# Patient Record
Sex: Female | Born: 1970
Health system: Southern US, Community
[De-identification: ages and names within clinical notes are randomized; demographics above are authoritative.]

## PROBLEM LIST (undated history)

## (undated) HISTORY — PX: BREAST SURGERY: SHX581

## (undated) HISTORY — PX: THYROIDECTOMY, PARTIAL: SHX18

## (undated) HISTORY — PX: BREAST BIOPSY: SHX20

---

## 1998-12-03 ENCOUNTER — Other Ambulatory Visit: Admission: RE | Admit: 1998-12-03 | Discharge: 1998-12-03 | Payer: Self-pay | Admitting: Internal Medicine

## 1999-12-03 ENCOUNTER — Encounter: Payer: Self-pay | Admitting: Internal Medicine

## 1999-12-03 ENCOUNTER — Encounter: Admission: RE | Admit: 1999-12-03 | Discharge: 1999-12-03 | Payer: Self-pay | Admitting: Internal Medicine

## 1999-12-08 ENCOUNTER — Encounter: Payer: Self-pay | Admitting: Internal Medicine

## 1999-12-08 ENCOUNTER — Encounter: Admission: RE | Admit: 1999-12-08 | Discharge: 1999-12-08 | Payer: Self-pay | Admitting: Internal Medicine

## 1999-12-08 ENCOUNTER — Other Ambulatory Visit: Admission: RE | Admit: 1999-12-08 | Discharge: 1999-12-08 | Payer: Self-pay | Admitting: Internal Medicine

## 2001-11-15 ENCOUNTER — Encounter: Admission: RE | Admit: 2001-11-15 | Discharge: 2001-11-15 | Payer: Self-pay | Admitting: Obstetrics & Gynecology

## 2001-11-15 ENCOUNTER — Encounter: Payer: Self-pay | Admitting: Obstetrics & Gynecology

## 2002-08-07 ENCOUNTER — Inpatient Hospital Stay (HOSPITAL_COMMUNITY): Admission: AD | Admit: 2002-08-07 | Discharge: 2002-08-07 | Payer: Self-pay | Admitting: *Deleted

## 2002-09-22 ENCOUNTER — Inpatient Hospital Stay (HOSPITAL_COMMUNITY): Admission: AD | Admit: 2002-09-22 | Discharge: 2002-09-26 | Payer: Self-pay | Admitting: *Deleted

## 2002-09-23 ENCOUNTER — Encounter (INDEPENDENT_AMBULATORY_CARE_PROVIDER_SITE_OTHER): Payer: Self-pay | Admitting: Specialist

## 2002-09-27 ENCOUNTER — Encounter: Admission: RE | Admit: 2002-09-27 | Discharge: 2002-10-27 | Payer: Self-pay | Admitting: Obstetrics & Gynecology

## 2002-10-20 ENCOUNTER — Other Ambulatory Visit: Admission: RE | Admit: 2002-10-20 | Discharge: 2002-10-20 | Payer: Self-pay | Admitting: Obstetrics & Gynecology

## 2003-10-05 ENCOUNTER — Other Ambulatory Visit: Admission: RE | Admit: 2003-10-05 | Discharge: 2003-10-05 | Payer: Self-pay | Admitting: *Deleted

## 2004-04-18 ENCOUNTER — Encounter (INDEPENDENT_AMBULATORY_CARE_PROVIDER_SITE_OTHER): Payer: Self-pay | Admitting: Specialist

## 2004-04-18 ENCOUNTER — Inpatient Hospital Stay (HOSPITAL_COMMUNITY): Admission: AD | Admit: 2004-04-18 | Discharge: 2004-04-21 | Payer: Self-pay | Admitting: *Deleted

## 2004-04-22 ENCOUNTER — Encounter: Admission: RE | Admit: 2004-04-22 | Discharge: 2004-05-22 | Payer: Self-pay | Admitting: *Deleted

## 2005-09-30 ENCOUNTER — Ambulatory Visit: Payer: Self-pay | Admitting: Internal Medicine

## 2005-11-30 ENCOUNTER — Ambulatory Visit: Payer: Self-pay | Admitting: Internal Medicine

## 2006-04-01 IMAGING — US US OB LIMITED
1 series · 14 of 18 positions shown · non-contrast
Comparison: none

CLINICAL DATA: 37 week 1 day assigned gestational age.  Bleeding.  Ruptured membranes.

[Series 1: unknown · 0.35mm/px · 14 of 18 slices shown]
[im 1/18]
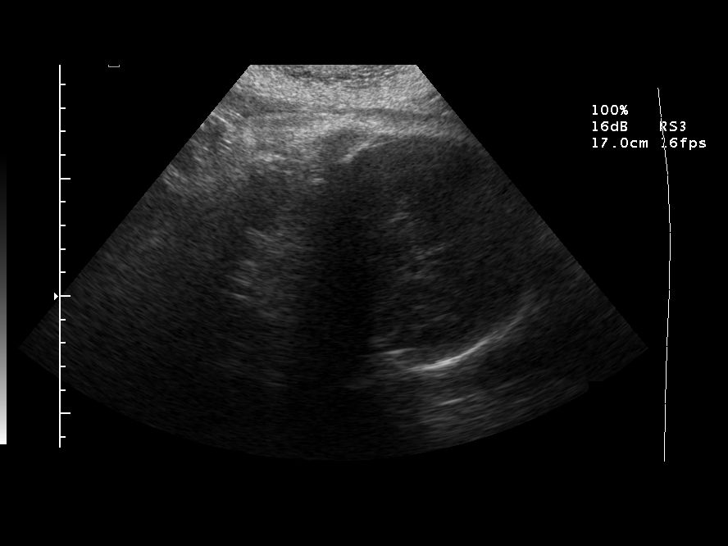
[im 2/18]
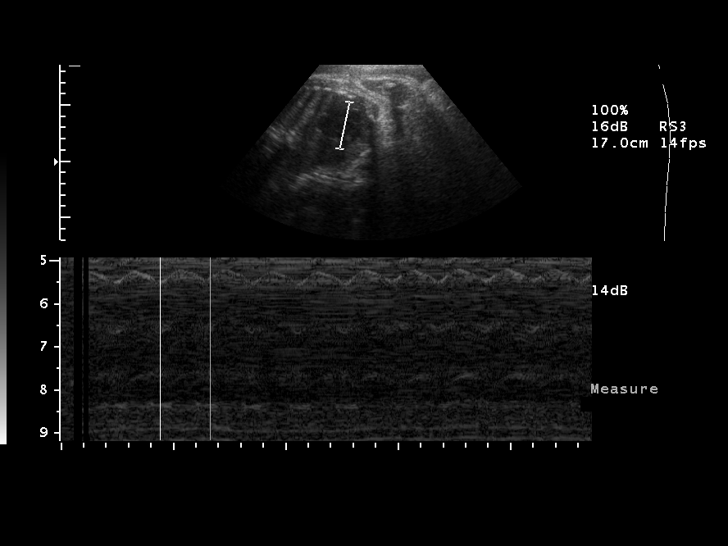
[im 4/18]
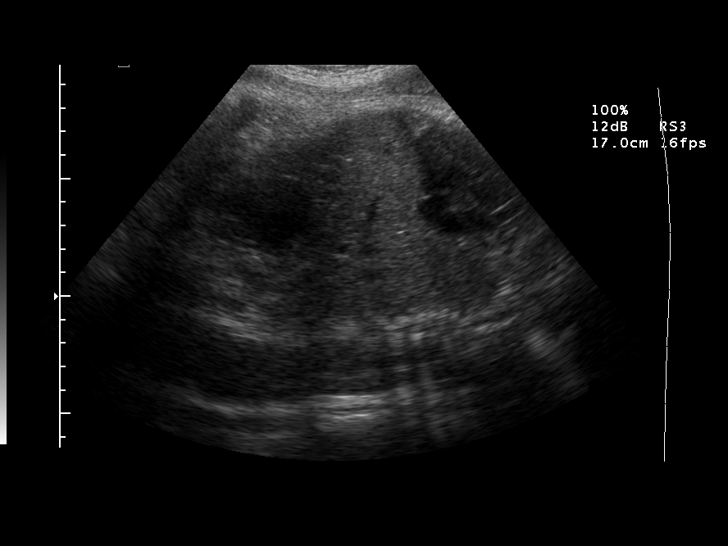
[im 5/18]
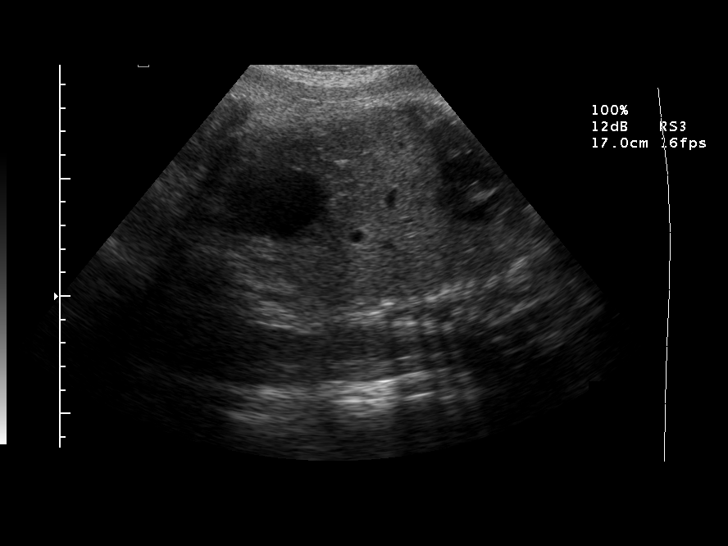
[im 6/18]
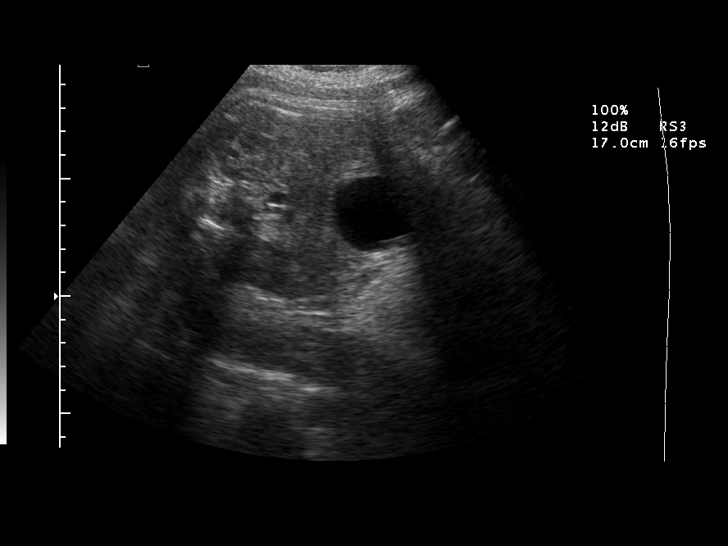
[im 8/18]
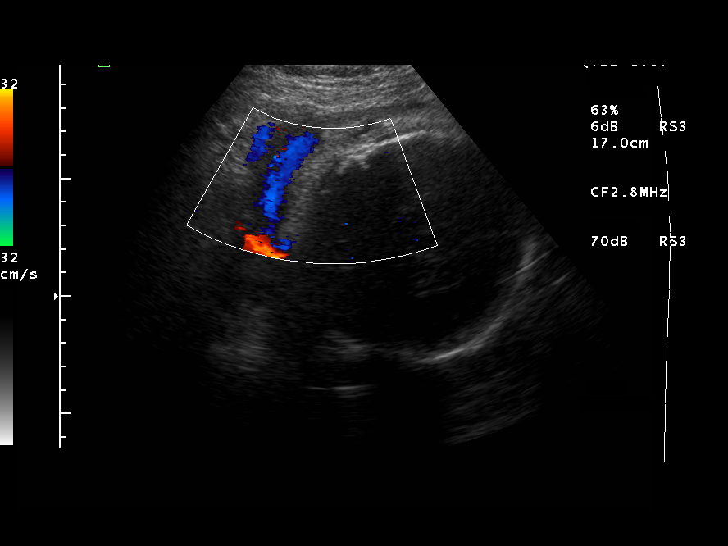
[im 9/18]
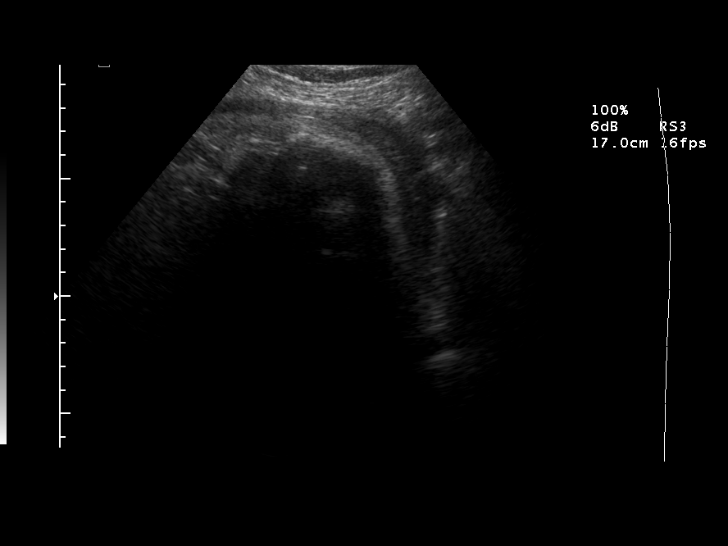
[im 10/18]
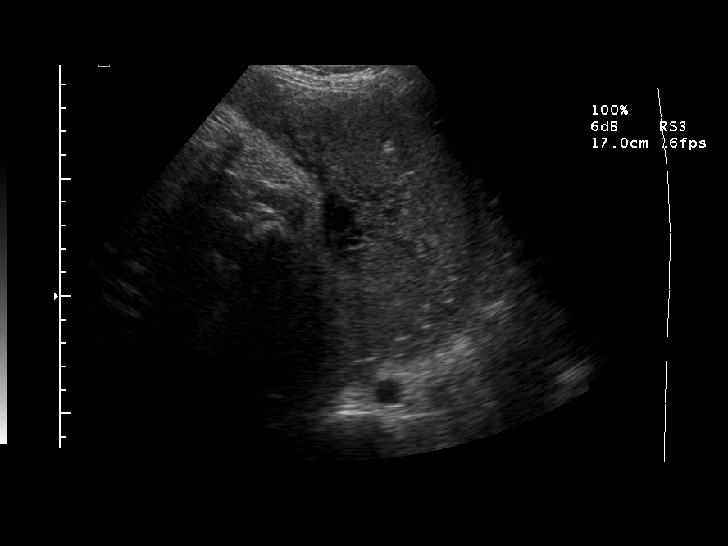
[im 11/18]
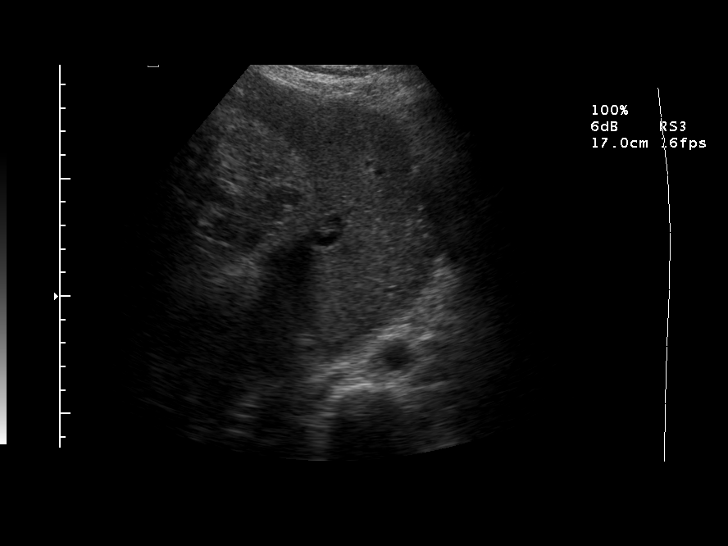
[im 13/18]
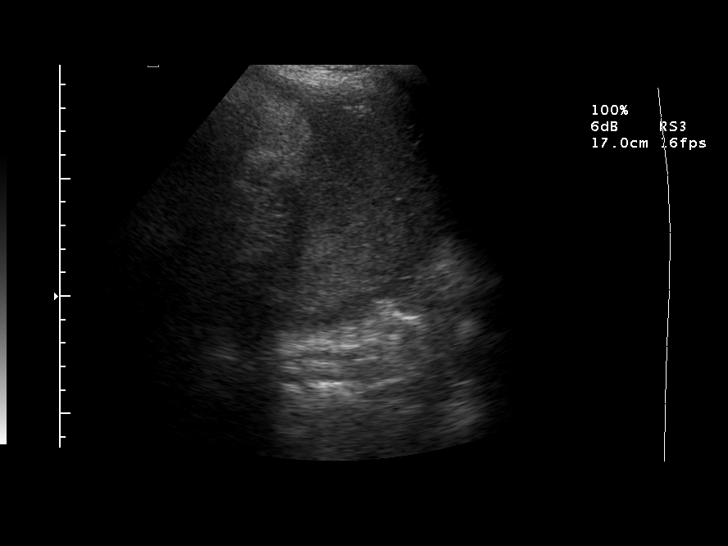
[im 14/18]
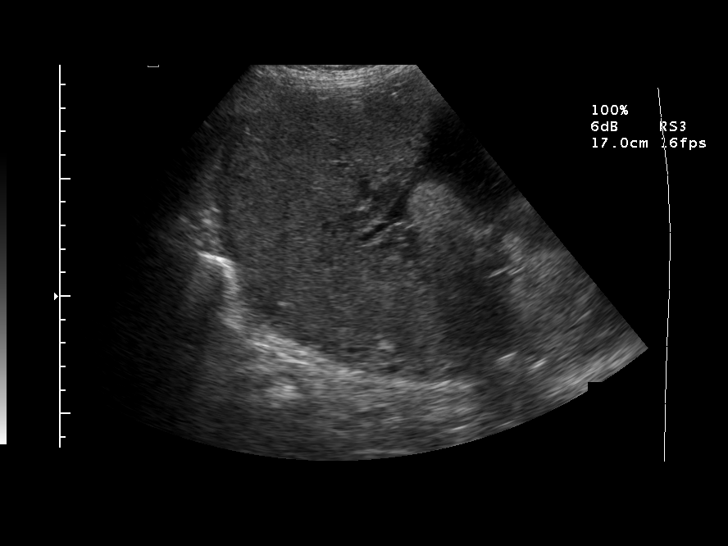
[im 15/18]
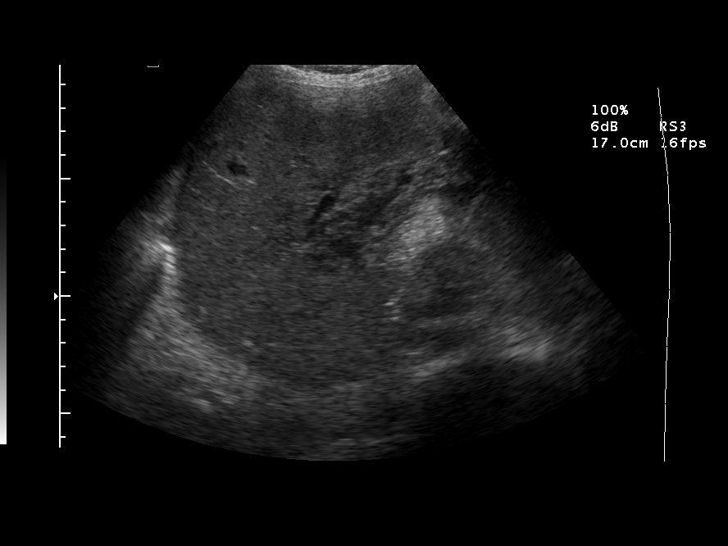
[im 17/18]
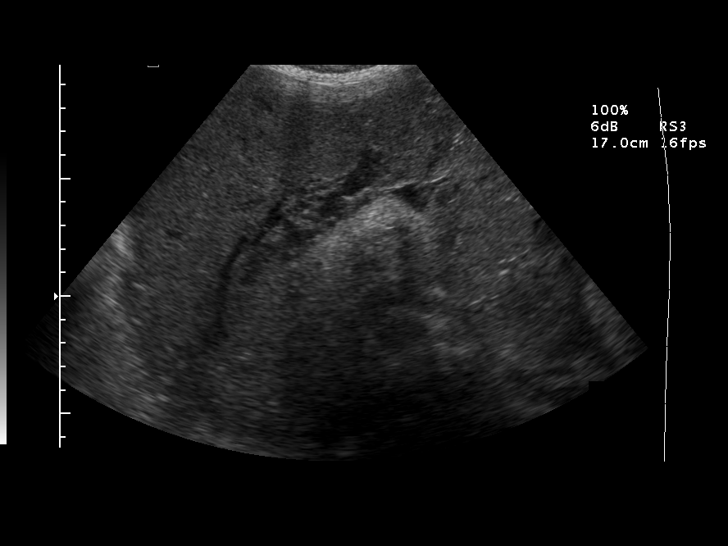
[im 18/18]
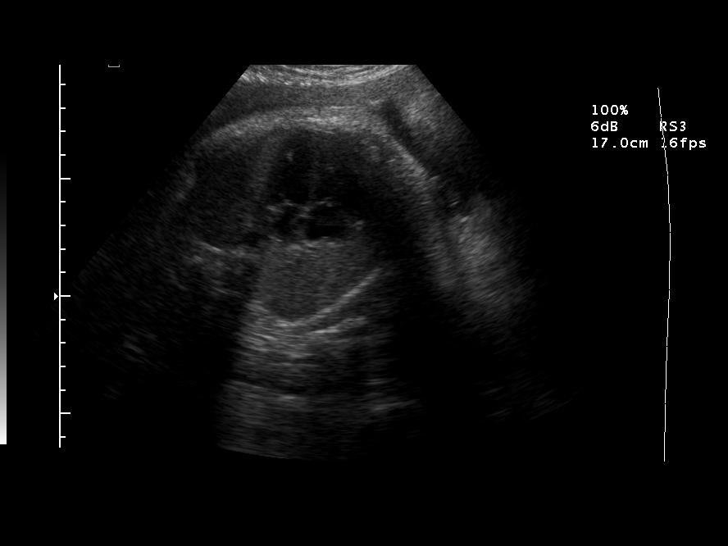

[14 of 18 positions shown; findings below may reference images not displayed]

LIMITED OBSTETRICAL ULTRASOUND
Number of Fetuses:  1
Heart Rate:  135
Movement:  Yes
Breathing:  No
Presentation:  Cephalic 
Placental Location:  Fundal, posterior
Grade:  II;  No placental abnormality or previa identified  
Amniotic Fluid (Subjective):  Decreased
Amniotic Fluid (Objective):  0 cm AFI 

Fetal measurements and complete anatomic evaluation were not requested.  The following fetal anatomy was visualized during this exam:  Four chamber heart, stomach, kidneys, bladder, and diaphragm 

MATERNAL FINDINGS
Cervix:  Not evaluated
IMPRESSION: Single living intrauterine fetus in cephalic presentation.  Oligohydramnios with no measurable pockets, consistent with history of spontaneous rupture of membranes.

## 2007-04-21 ENCOUNTER — Ambulatory Visit: Payer: Self-pay | Admitting: Family Medicine

## 2007-04-22 DIAGNOSIS — B009 Herpesviral infection, unspecified: Secondary | ICD-10-CM | POA: Insufficient documentation

## 2007-04-22 DIAGNOSIS — R030 Elevated blood-pressure reading, without diagnosis of hypertension: Secondary | ICD-10-CM | POA: Insufficient documentation

## 2007-05-09 DIAGNOSIS — I10 Essential (primary) hypertension: Secondary | ICD-10-CM | POA: Insufficient documentation

## 2007-05-23 ENCOUNTER — Ambulatory Visit: Payer: Self-pay | Admitting: Internal Medicine

## 2007-05-23 DIAGNOSIS — R002 Palpitations: Secondary | ICD-10-CM | POA: Insufficient documentation

## 2007-06-21 ENCOUNTER — Encounter: Admission: RE | Admit: 2007-06-21 | Discharge: 2007-06-21 | Payer: Self-pay | Admitting: *Deleted

## 2011-01-09 NOTE — Op Note (Signed)
NAMEGERLEAN, CID                           ACCOUNT NO.:  [id number]   MEDICAL RECORD NO.:  [id number]                   PATIENT TYPE:  INP   LOCATION:  9128                                 [facility]   PHYSICIAN:  [place] B. Earlene Plater, M.D.               DATE OF BIRTH:  [birth year]   DATE OF PROCEDURE:  04/19/2004  DATE OF DISCHARGE:                                 OPERATIVE REPORT   PREOPERATIVE DIAGNOSES:  1. Intrauterine pregnancy at 37 weeks.  2. Previous cesarean section.  3. Sudden onset of heavy vaginal bleeding, suspicious for abruptio placenta.   POSTOPERATIVE DIAGNOSES:  1. Intrauterine pregnancy at 37 weeks.  2. Previous cesarean section.  3. Sudden onset of heavy vaginal bleeding, abruptio placenta.   PROCEDURE:  Repeat low transverse cesarean section.   SURGEON:  Chester Holstein. Earlene Plater, M.D.   ANESTHESIA:  Epidural.   INDICATIONS:  Gravida 2, para 1 at 37 weeks presents to maternity admissions  with ruptured membranes and subsequent heavy vaginal bleeding with mild  uterine contractions and mild variables on the monitor.  Given the heavy  bleeding, previous cesarean section it was recommended to the patient that  she undergo repeat cesarean section.  The patient was advised of the risks  of surgery including infection, bleeding, damage to surrounding organs.   FINDINGS:  Viable female infant, 8 pounds 0 ounces, Apgars 9 and 9.  There is  a 5-6 cm area of placental separation at the right lateral margin.   DESCRIPTION OF PROCEDURE:  The patient was taken to the operating room and  spinal anesthesia obtained.  She was prepped and draped in the standard  fashion.  Incision was made through the previous scar which was noted to be  a keloid.  Therefore, the incision was made in the lower aspect of the  keloid to be revised after delivery.  Dissection was carried sharply to the  fascia.  The fascia was divided sharply.  The superior aspect of the fascia  was elevated and  the underlying rectus muscles dissected off sharply.  It  was carried down superiorly in a similar fashion.  The midline was  identified and the posterior sheath and peritoneum elevated and entered  sharply.  It was extended inferiorly sharply.  Bladder blade inserted and  bladder flap created with sharp and blunt technique.  The uterine incision  was created in a low transverse fashion with a knife.  Clear fluid was noted  at amniotomy.  The infant's head was delivered through the incision with the  assistance of the kiwi.  This was placed in the midline anterior to the  posterior fontanel.  One traction necessary from the mid green zone.  No  popoffs.  The remainder of the infant was delivered without difficulty.  The  cord was clamped and cut.  Nuchal cord x1 noted.   The uterus was inspected.  The placenta was palpated.  There was an area  that had been separated from the uterus consistent with abruptio placenta as  outlined above.  The placenta was removed manually, and the uterus cleared  of all clots and debris.  The uterine incision was inspected and was free of  extension.  The incision was then closed in a running locked stitch of 0  chromic.  A second imbricating layer was placed with the same suture.  Hemostasis was obtained.  The bladder flap and subfascial spaces were  inspected. Both were hemostatic.  The fascia was closed with a running  stitch of 0 Vicryl.  The superior aspect of the keloid scar was then excised  sharply.  The incision was irrigated and made hemostatic with the Bovie.  The subcutaneous tissue was reapproximated with interrupted stitches of 0  chromic.  The skin was closed with staples.  The patient tolerated the  procedure well, and there were no complications.  She was taken to the  recovery room awake, alert and in stable condition.  All counts were correct  per the operating room staff.                                               Gerri Spore B. Earlene Plater,  M.D.    WBD/MEDQ  D:  04/19/2004  T:  04/20/2004  Job:  [id number]

## 2011-01-09 NOTE — Discharge Summary (Signed)
Martha Johnston, Martha Johnston                           ACCOUNT NO.:  [id number]   MEDICAL RECORD NO.:  [id number]                   PATIENT TYPE:  INP   LOCATION:  9128                                 [facility]   PHYSICIAN:  [place] B. Earlene Plater, M.D.               DATE OF BIRTH:  [birth year]   DATE OF ADMISSION:  04/18/2004  DATE OF DISCHARGE:  04/21/2004                                 DISCHARGE SUMMARY   ADMISSION DIAGNOSES:  1.  Thirty-seven-week intrauterine pregnancy.  2.  Previous cesarean section.  3.  Abruptio placenta.   DISCHARGE DIAGNOSES:  1.  Thirty-seven-week intrauterine pregnancy.  2.  Previous cesarean section.  3.  Abruptio placenta.   PROCEDURE:  Repeat low transverse cesarean section.   HISTORY OF PRESENT ILLNESS:  The patient was admitted as a gravida 2 para 1  at 37 weeks when she presented to maternity admission with rupture of  membranes and sudden onset of subsequent heavy vaginal bleeding with mild  uterine contractions and mild variable decelerations.  Clinical suspicion  was for abruptio placenta related to ruptured membranes.   HOSPITAL COURSE:  On the day of admission the patient was delivered by  repeat low transverse cesarean section.  Findings at the time of surgery  included a viable female infant that weighed 8 pounds 0 ounces, Apgars were 9  and 9.  A 5-6 cm area of placental separation at the right lateral margin  was noted during surgery.   Postoperatively, the patient rapidly regains her ability to ambulate, void,  and tolerate a regular diet.  She was discharged home on postoperative day  #3 in satisfactory condition.   DISCHARGE INSTRUCTIONS:  Standard preprinted instructions given prior to  dismissal.   DISCHARGE MEDICATIONS:  Darvocet-N 100 one to two p.o. q.4-6h. p.r.n. pain.   DISPOSITION AT DISCHARGE:  Satisfactory.   FOLLOW-UP:  [facility] in 4-6 weeks.                                               Gerri Spore B. Earlene Plater, M.D.    WBD/MEDQ  D:  05/06/2004  T:  05/06/2004  Job:  [id number]

## 2011-01-09 NOTE — H&P (Signed)
[name], Martha Johnston                             ACCOUNT NO.:  [id number]   MEDICAL RECORD NO.:  [id number]                   PATIENT TYPE:  INP   LOCATION:  9167                                 [facility]   PHYSICIAN:  Genia Del, M.D.             DATE OF BIRTH:  11-Jul-1971   DATE OF ADMISSION:  09/22/2002  DATE OF DISCHARGE:                                HISTORY & PHYSICAL   REASON FOR ADMISSION:  Induction for pregnancy-induced hypertension.   HISTORY OF PRESENT ILLNESS:  Martha Johnston is a 40 year old G1 with an expected  date of delivery of September 29, 2002 at [redacted] weeks gestation.  Martha Johnston was  followed closely at the office for a recently increased blood pressure.  At  37+ weeks her blood pressure was 120/86.  When she came back at 38+ weeks on  January 28, it was 138/96. Repeated blood pressure was 140/92 and 140/86.  She had no protein in the urine, no symptoms of preeclampsia.  Pregnancy  induced hypertension labs were done and came back within normal limits  except for a mild decrease in platelets at 125,000 but those were 133,000 on  August 28, 2002.  The patient was, therefore, sent back home on relative  rest and PIH warnings were given.  She called back today, January 30, for  constant, very intense, epigastric pain over the course of the night and  most of the day.  She denied any visual symptoms or any headaches and her  swallowing was stable.  She reports trying Maalox thinking that this was  acid reflux but with no improvement.  The patient assessed was in the  office.  Her blood pressure was 140/100 and repeated blood pressure was  130/94.  Urine was negative for protein.  GTRs were 3/4 bilaterally and no  clonus was present.  Given those findings, the patient was considered PIH,  maybe mild preclamptic.  The decision was taken given her term pregnancy  with unfavorable cervix to induce labor.   PAST MEDICAL HISTORY:  Scarlet fever.   PAST SURGICAL  HISTORY:  Partial thyroidectomy in 1997 for a benign nodule.  No current thyroid dysfunction.   ALLERGIES:  No known drug allergies.   CURRENT MEDICATIONS:  Prenatal vitamins.   SOCIAL HISTORY:  Married, nonsmoker.   FAMILY HISTORY:  Noncontributory.   HISTORY OF PRESENT PREGNANCY:  First trimester was normal.  The labs in the  first trimester showed hemoglobin of 14.2, platelets 160,000, blood type RHB  positive.  RH antibodies negative.  RPR nonreactive.  HBsAG negative.  HIV  nonreactive.  Rubella titers immune.  Gonorrhea and Chlamydia negative.  ___________ within normal limits.  In the second trimester, the patient had  a triple test that was within normal limits.  At 20 weeks, she had an  ultrasound review of anatomy that was within normal limits.  Normal  posterior  placenta.  Cervix was closed, 5 cm long.  Acute __________ was  treated with Terazol at that time.  In the third trimester, one hour GTT was  increased at 142.  Three-hour GTT was within normal limits.  A repeat  platelet level was done because of a borderline number in the first  trimester and they were at 133,000 on August 28, 2002.  At 36+ weeks an  ultrasound was done.  Estimated fetal weight was at the 36th percentile.  Amniotic fluid index was within normal limits.  Group B strep at 35 weeks  was negative.  Blood pressures - see HPI.   REVIEW OF SYSTEMS:  CONSTITUTIONAL:  Negative.  HEENT:  Negative.  GI:  Positive for gastroesophageal reflux versus epigastric pain.  URO:  Negative.  CARDIOVASCULAR:  Negative.  RESPIRATORY:  Negative.  DERMATOLOGY:  Negative.  ENDOCRINOLOGY:  Negative.  NEUROLOGY:  Negative.   PHYSICAL EXAMINATION:  GENERAL:  No acute distress.  VITAL SIGNS:  Pulse 80, blood pressure 140/100, then 130/94, respiratory  rate 16, temperature afebrile.  LUNGS:  Clear bilaterally.  HEART:  Regular cardiac rhythm.  No murmur.  ABDOMEN:  Gravid.  Cephalic presentation.  Uterine height 38 cm.   VAGINAL:  1-2 cm dilated, 70% effaced, vertex minus 2.  Marked membranes  intact.  EXTREMITIES:  Lower limbs - mild edema.  DTRs 3/4 bilaterally.  No clonus.   LABORATORY DATA:  Urine negative for protein and sugar.  Fetal heart tones  140 per minute.   IMPRESSION:  Gravida 1, [redacted] weeks gestation with pregnancy induced  hypertension, possible mild preeclampsia, appropriate for gestational age.  Favorable cervix.  Group B strep negative.   PLAN:  Admit to labor and delivery, low dose protocol induction, artificial  rupture of membranes when possible, monitoring, pregnancy induced  hypertension labs. Consider magnesium sulfate per results and clinical  findings.                                               Genia Del, M.D.    ML/MEDQ  D:  09/22/2002  T:  09/22/2002  Job:  [id number]

## 2011-01-09 NOTE — Consult Note (Signed)
   Martha Johnston, Martha Johnston                             ACCOUNT NO.:  [id number]   MEDICAL RECORD NO.:  [id number]                   PATIENT TYPE:   LOCATION:                                       [facility]   PHYSICIAN:  Lenoard Aden, M.D.             DATE OF BIRTH:  1970/09/02   DATE OF CONSULTATION:  08/07/2002  DATE OF DISCHARGE:                                   CONSULTATION   CHIEF COMPLAINT:  Abdominal trauma.   HISTORY OF PRESENT ILLNESS:  The patient is a 40 -year-old white female,  Gravida I, Para 0, EDD September 29, 2001 at 32 plus weeks, who presents  status post falling on the ice this morning. The patient had injury only to  her buttocks area. No abdominal trauma or direct abdominal injury exposure  noted.   PAST MEDICAL HISTORY:  Remarkable for a non-contributory obstetric history.   MEDICATIONS:  Prenatal vitamins.   ALLERGIES:  No known drug allergies.   PAST SURGICAL HISTORY:  The patient had partial subtotal thyroidectomy  currently on no replacement therapy. Wisdom teeth extraction as well.   FAMILY HISTORY:  Coronary artery disease, emphysema, and adult onset  diabetes mellitus.   LABORATORY DATA:  Prenatal lab data blood type B+. Rh antibody negative.  Rubella immune. Hepatitis B surface negative. HIV non-reactive.   PHYSICAL EXAMINATION:  GENERAL: A well developed, well nourished anxious  appearing white female in no acute distress.  HEENT: Normal.  LUNGS: Clear.  HEART: Regular rate and rhythm.  ABDOMEN: Soft, gravid, and nontender. No cardiovascular tenderness.  GU: Cervix closed, 3 cm long, vertex minus 2. NST reactive. Irregular  contractions are noted. No decelerations are noted.  EXTREMITIES: Normal.  NEURO: Nonfocal.   IMPRESSION:  Status post minor trauma. No evidence of preterm labor. No  bleeding. No evidence of placental abruption.    PLAN:  Discharge to home. Precautions given. Abruption warnings. Discussed  fetal activity.  Discussed patient knowledge and will proceed. Follow-up in  the office in one week.                                               Lenoard Aden, M.D.    RJT/MEDQ  D:  08/07/2002  T:  08/07/2002  Job:  [id number]   cc:   Lenoard Aden, M.D.  [address], [address]  [place]  [place] [address]  Fax: [phone]

## 2011-01-09 NOTE — Op Note (Signed)
Martha Johnston, Martha Johnston                             ACCOUNT NO.:  [id number]   MEDICAL RECORD NO.:  [id number]                   PATIENT TYPE:  INP   LOCATION:  9167                                 [facility]   PHYSICIAN:  [place] B. Earlene Plater, M.D.               DATE OF BIRTH:  03/14/1971   DATE OF PROCEDURE:  09/23/2002  DATE OF DISCHARGE:                                 OPERATIVE REPORT   PREOPERATIVE DIAGNOSES:  1. Intrauterine pregnancy at 39 weeks.  2. HELLP syndrome.  3. Transverse arrest.  4. Unsuccessful trial of vacuum.   POSTOPERATIVE DIAGNOSES:  1. Intrauterine pregnancy at 39 weeks.  2. HELLP syndrome.  3. Transverse arrest.  4. Unsuccessful trial of vacuum.   PROCEDURE:  Primary low transverse cesarean section after unsuccessful  vacuum attempt.   SURGEON:  Chester Holstein. Earlene Plater, M.D.   ANESTHESIA:  Epidural.   FINDINGS:  Viable female infant.  Apgars 8 and 9.  LOP position.  Weight 8  pounds 2 ounces.  Normal appearing tubes, uterus and ovaries.   ESTIMATED BLOOD LOSS:  700 mL.   FLUIDS:  1000 mL.   URINE OUTPUT:  175 mL.   DRAINS:  Foley.   COMPLICATIONS:  None.   INDICATIONS:  The patient is being induced with the above issues.  Progressed to complete.  Pushed to +2 - +3 and was found to be in transverse  position.  Ultimately found to be left occipitotransverse.  The patient was  given the option to continue pushing, cesarean section or trial of a vacuum  delivery.  The patient wished to have a trial of the vacuum.   DESCRIPTION OF PROCEDURE:  With her epidural anesthesia functioning  normally, Foley catheter was removed from the bladder.  Perineum prepped and  the Mityvac mushroom cup placed on the fetal vertex in the midline just  posterior to the anterior fontanel.  It was placed into the high green zone  and with three contractions there was no descent.  Therefore, I recommended  we proceed with cesarean section.   The patient was subsequently taken  to the operating room with her epidural  anesthesia functioning, prepped and draped in the standard fashion, and a  Foley catheter reinserted into the bladder.   A Pfannenstiel incision was made with the knife and carried sharply to the  underlying fascia.  The fascia was divided sharply, and the incision  extended bilaterally with Mayo scissors.  Kocher clamps were used to elevate  the superior aspect and incision of the underlying rectus muscles sharply.  The peritoneum was entered in a similar fashion.  The posterior superior  peritoneum elevated and entered sharply, continued superiorly and inferiorly  sharply with good visualization of the surrounding organs.   Bladder blade inserted.  Vesicouterine peritoneum identified.  Bladder flap  created with sharp and blunt technique.   The bladder blade was  reinserted and the uterine incision was made in a low  transverse fashion with the knife.  Clear fluid noted at amniotomy.  Incision extended laterally with bandage scissors.  The infant's head was  delivered through the incision without difficulty.  The nose and mouth were  suctioned with the bulb and the remainder of the infant delivered without  difficulty.  The cord was clamped and cut, and the infant handed off to the  waiting pediatricians.  Intravenous Mefoxin was given at cord clamp.  The  placenta was removed by manual expression and a segment of cord clamped for  cord blood collection.  The uterus was cleared of all clots and debris.  The  incision was inspected and was free of extension.  The incision was then  closed in a running locked stitch of 0 Monocryl.  A second imbricating layer  was placed with hemostasis obtained.  The bladder flap and subfascial spaces  were hemostatic.  The fascia was closed in a running stitch of 0 Vicryl.  Subcutaneous tissues were irrigated and were hemostatic.  Skin was closed  with staples.   The GN:FAOZ collection was done by standard  protocol and given to the  patient's hospital for shipping.   The patient tolerated the procedure well with no complications.  She was  taken to the recovery room, awake, alert and in stable condition.                                               Gerri Spore B. Earlene Plater, M.D.    WBD/MEDQ  D:  09/23/2002  T:  09/23/2002  Job:  [id number]

## 2011-01-09 NOTE — Discharge Summary (Signed)
NAMECATALAYA, Martha Johnston                             ACCOUNT NO.:  [id number]   MEDICAL RECORD NO.:  [id number]                   PATIENT TYPE:  INP   LOCATION:  9121                                 [facility]   PHYSICIAN:  [place] B. Earlene Plater, M.D.               DATE OF BIRTH:  1971/02/10   DATE OF ADMISSION:  09/22/2002  DATE OF DISCHARGE:  09/26/2002                                 DISCHARGE SUMMARY   ADMISSION DIAGNOSES:  1. A 39 week intrauterine pregnancy.  2. Hemolysis, elevated liver enzymes, and low platelet count syndrome.  3. Transverse arrest.   PROCEDURES:  1. Admission for induction of labor.  2. Unsuccessful trial of vacuum delivery for transverse arrest.  3. Primary low transverse cesarean section.   HISTORY OF PRESENT ILLNESS:  For complete details, see the H&P in the chart.  Briefly, the patient presented as a 40 year old white female, gravida 1,  para 0, at 39 weeks, with a two to three day history of epigastric pain.  Was seen in the office and was found to have elevated blood pressures in the  150's/90's to 100 range.  Was sent from the office for admission, and on  admission labs was found to have hemolysis, elevated liver enzymes, and low  platelet count syndrome with platelets of 111,000, AST 58, ALT 47.  She did  have a favorable cervix at 3 to 4 cm and 50% effacement, and therefore  induction was started.   HOSPITAL COURSE:  The patient was admitted, started on magnesium procedure  prophylaxis.  Started on Pitocin for induction, and subsequently membranes  were ruptured.  The patient progressed to complete without difficulty.  She  pushed for about 2-1/2 hours and would not descend beyond +2 station.  Was  found to be in a transverse position.  Options were discussed, including  continued pushing, vacuum or cesarean.  The patient pushed about another 30  minutes, and ultimately pushed a total of about three hours and could not  descend beyond +3 position.   The Mighty vac vacuum was attempted for three  pulls, no pop-offs, and no descent.  Therefore, I recommended we proceed  with cesarean section.   The patient subsequently delivered via primary low transverse cesarean  section.  Viable female with Apgar's of 8 and 9, LOT position, weighed 8  pounds 2 ounces, normal-appearing uterus, tubes, and ovaries.   Postoperatively, the patient was kept on magnesium for about 48 hours due to  slow diuresis.  Her postoperative labs did climb initially.  Her platelet  count nadir was 62,000, and maximum AST of 128, and ALT of 75.  However, by  the third postoperative day, platelet count and other laboratory work was  returning to normal values.  The patient was therefore discharged to home in  satisfactory condition.   DISCHARGE INSTRUCTIONS:  Preeclampsia precautions given, and standard  handout  for discharge given.   DISPOSITION:  Discharged satisfactory.   FOLLOWUP:  One week for blood pressure check, and four to six weeks for  routine postpartum check.    DISCHARGE MEDICATIONS:  Tylox one or two tabs q.4-6h. p.r.n. pain.   Recommend against the use of non-steroidals given history of the low  platelet count.                                                 Gerri Spore B. Earlene Plater, M.D.    WBD/MEDQ  D:  10/22/2002  T:  10/22/2002  Job:  [id number]

## 2012-05-31 ENCOUNTER — Other Ambulatory Visit: Payer: Self-pay | Admitting: Obstetrics & Gynecology

## 2012-05-31 DIAGNOSIS — Z1231 Encounter for screening mammogram for malignant neoplasm of breast: Secondary | ICD-10-CM

## 2012-07-05 ENCOUNTER — Ambulatory Visit
Admission: RE | Admit: 2012-07-05 | Discharge: 2012-07-05 | Disposition: A | Discharge status: Home / Self Care | Payer: BC Managed Care – PPO | Source: Ambulatory Visit | Attending: Obstetrics & Gynecology | Admitting: Obstetrics & Gynecology

## 2012-07-05 DIAGNOSIS — Z1231 Encounter for screening mammogram for malignant neoplasm of breast: Secondary | ICD-10-CM

## 2012-07-08 ENCOUNTER — Other Ambulatory Visit: Payer: Self-pay | Admitting: Obstetrics & Gynecology

## 2012-07-08 DIAGNOSIS — R928 Other abnormal and inconclusive findings on diagnostic imaging of breast: Secondary | ICD-10-CM

## 2012-07-18 ENCOUNTER — Ambulatory Visit
Admission: RE | Admit: 2012-07-18 | Discharge: 2012-07-18 | Disposition: A | Discharge status: Home / Self Care | Payer: BC Managed Care – PPO | Source: Ambulatory Visit | Attending: Obstetrics & Gynecology | Admitting: Obstetrics & Gynecology

## 2012-07-18 DIAGNOSIS — R928 Other abnormal and inconclusive findings on diagnostic imaging of breast: Secondary | ICD-10-CM

## 2013-07-10 ENCOUNTER — Other Ambulatory Visit: Payer: Self-pay

## 2013-07-10 DIAGNOSIS — Z1231 Encounter for screening mammogram for malignant neoplasm of breast: Secondary | ICD-10-CM

## 2013-07-18 ENCOUNTER — Ambulatory Visit
Admission: RE | Admit: 2013-07-18 | Discharge: 2013-07-18 | Disposition: A | Discharge status: Home / Self Care | Payer: BC Managed Care – PPO | Source: Ambulatory Visit

## 2013-07-18 DIAGNOSIS — Z1231 Encounter for screening mammogram for malignant neoplasm of breast: Secondary | ICD-10-CM

## 2013-07-21 ENCOUNTER — Other Ambulatory Visit: Payer: Self-pay | Admitting: Obstetrics & Gynecology

## 2013-07-21 DIAGNOSIS — R928 Other abnormal and inconclusive findings on diagnostic imaging of breast: Secondary | ICD-10-CM

## 2013-07-26 ENCOUNTER — Ambulatory Visit
Admission: RE | Admit: 2013-07-26 | Discharge: 2013-07-26 | Disposition: A | Discharge status: Home / Self Care | Payer: BC Managed Care – PPO | Source: Ambulatory Visit | Attending: Obstetrics & Gynecology | Admitting: Obstetrics & Gynecology

## 2013-07-26 ENCOUNTER — Other Ambulatory Visit: Payer: Self-pay | Admitting: Obstetrics & Gynecology

## 2013-07-26 DIAGNOSIS — R928 Other abnormal and inconclusive findings on diagnostic imaging of breast: Secondary | ICD-10-CM

## 2013-08-04 ENCOUNTER — Other Ambulatory Visit: Payer: BC Managed Care – PPO

## 2014-02-06 ENCOUNTER — Other Ambulatory Visit: Payer: Self-pay | Admitting: Obstetrics & Gynecology

## 2014-02-06 DIAGNOSIS — N6489 Other specified disorders of breast: Secondary | ICD-10-CM

## 2014-03-27 ENCOUNTER — Ambulatory Visit
Admission: RE | Admit: 2014-03-27 | Discharge: 2014-03-27 | Disposition: A | Discharge status: Home / Self Care | Payer: BC Managed Care – PPO | Source: Ambulatory Visit | Attending: Obstetrics & Gynecology | Admitting: Obstetrics & Gynecology

## 2014-03-27 DIAGNOSIS — N6489 Other specified disorders of breast: Secondary | ICD-10-CM

## 2015-06-17 ENCOUNTER — Other Ambulatory Visit: Payer: Self-pay

## 2015-06-17 DIAGNOSIS — Z1231 Encounter for screening mammogram for malignant neoplasm of breast: Secondary | ICD-10-CM

## 2015-06-19 ENCOUNTER — Other Ambulatory Visit: Payer: Self-pay | Admitting: Obstetrics & Gynecology

## 2015-06-19 DIAGNOSIS — N63 Unspecified lump in unspecified breast: Secondary | ICD-10-CM

## 2015-06-19 DIAGNOSIS — N644 Mastodynia: Secondary | ICD-10-CM

## 2015-06-25 ENCOUNTER — Ambulatory Visit
Admission: RE | Admit: 2015-06-25 | Discharge: 2015-06-25 | Disposition: A | Discharge status: Home / Self Care | Payer: BLUE CROSS/BLUE SHIELD | Source: Ambulatory Visit | Attending: Obstetrics & Gynecology | Admitting: Obstetrics & Gynecology

## 2015-06-25 DIAGNOSIS — N63 Unspecified lump in unspecified breast: Secondary | ICD-10-CM

## 2015-06-25 DIAGNOSIS — N644 Mastodynia: Secondary | ICD-10-CM

## 2015-07-16 ENCOUNTER — Ambulatory Visit: Payer: Self-pay

## 2015-12-13 ENCOUNTER — Other Ambulatory Visit: Payer: Self-pay | Admitting: Obstetrics & Gynecology

## 2015-12-13 DIAGNOSIS — N63 Unspecified lump in unspecified breast: Secondary | ICD-10-CM

## 2015-12-24 ENCOUNTER — Other Ambulatory Visit: Payer: Self-pay | Admitting: Obstetrics & Gynecology

## 2015-12-24 ENCOUNTER — Ambulatory Visit
Admission: RE | Admit: 2015-12-24 | Discharge: 2015-12-24 | Disposition: A | Discharge status: Home / Self Care | Payer: BLUE CROSS/BLUE SHIELD | Source: Ambulatory Visit | Attending: Obstetrics & Gynecology | Admitting: Obstetrics & Gynecology

## 2015-12-24 DIAGNOSIS — N63 Unspecified lump in unspecified breast: Secondary | ICD-10-CM

## 2015-12-25 ENCOUNTER — Ambulatory Visit
Admission: RE | Admit: 2015-12-25 | Discharge: 2015-12-25 | Disposition: A | Discharge status: Home / Self Care | Payer: BLUE CROSS/BLUE SHIELD | Source: Ambulatory Visit | Attending: Obstetrics & Gynecology | Admitting: Obstetrics & Gynecology

## 2015-12-25 DIAGNOSIS — N63 Unspecified lump in unspecified breast: Secondary | ICD-10-CM

## 2016-02-03 ENCOUNTER — Encounter: Payer: Self-pay | Admitting: Family Medicine

## 2016-02-03 ENCOUNTER — Ambulatory Visit (INDEPENDENT_AMBULATORY_CARE_PROVIDER_SITE_OTHER): Payer: BLUE CROSS/BLUE SHIELD | Admitting: Family Medicine

## 2016-02-03 VITALS — BP 146/84 | HR 87 | Temp 98.3°F | Resp 12 | Ht 64.5 in | Wt 142.2 lb

## 2016-02-03 DIAGNOSIS — R030 Elevated blood-pressure reading, without diagnosis of hypertension: Secondary | ICD-10-CM

## 2016-02-03 DIAGNOSIS — F419 Anxiety disorder, unspecified: Secondary | ICD-10-CM

## 2016-02-03 MED ORDER — FLUOXETINE HCL 20 MG PO TABS: 20.0000 mg | ORAL_TABLET | Freq: Every day | ORAL | Status: DC

## 2016-02-03 NOTE — Patient Instructions (Signed)
A few things to remember from today's visit:   1. ELEVATED BLOOD PRESSURE WITHOUT DIAGNOSIS OF HYPERTENSION   2. Anxiety disorder, unspecified  - FLUoxetine (PROZAC) 20 MG tablet; Take 1 tablet (20 mg total) by mouth daily.  Dispense: 30 tablet; Refill: 3   Goal BP < 140/90. Follow up in 6-8 weeks.  DASH diet recommended: high in vegetables, fruits, low-fat dairy products, whole grains, poultry, fish, and nuts; and low in sweets, sugar-sweetened beverages, and red meats.     If you sign-up for My chart, you can communicate easier with us in case you have any question or concern.

## 2016-02-03 NOTE — Progress Notes (Signed)
Pre visit review using our clinic review tool, if applicable. No additional management support is needed unless otherwise documented below in the visit note. 

## 2016-02-03 NOTE — Progress Notes (Signed)
HPI:   [name] is a 45 y.o. female, who is here today to establish care with me.  She has not had a PCP for 3-4 years, usually she has labs check annually with her gyn. Last preventive routine visit: gyn for female preventive care, 06/2015.  S/P hemithyroidectomy due to a benign thyroid nodule.   Concerns today: anxiety.  According to patient and her daughter was diagnosed recently with anxiety, started on Prozac. States that she has noticed great deal of improvement in her daughter mood and behavior. Her daughter is currently following with a counselor and she has noticed that many of the symptoms her daughter relays to her counselor she has had for years. She wonders if she can try a low dose Prozac and see if this helps her anxiety,which she describes a mild. She denies depression or suicidal thoughts. She tried Celexa a few years ago and did not help.  She feels like hers is mainly social anxiety, feels overwhelmed. She denies depressed mood, suicidal thoughts, or Hx of bipolar disorder. Occasional insomnia, usually 1-2 times weekly and cause by thinking about something in particular she needs to do or did not do.  FHx negative for psychiatric conditions.  Elevated BP: She denies any Hx of HTN. She does not monitor BP at home, states that at her gyn's office it is fine.Sometiems her BP is elevated during OV's, attributed to "white coat synd." Denies severe/frequent headache, visual changes, chest pain, dyspnea, palpitation, claudication, focal weakness, or edema.  She exercises regularly and eats healthy.   Review of Systems  Constitutional: Negative for fever, activity change, appetite change, fatigue and unexpected weight change.  HENT: Negative for facial swelling, mouth sores, nosebleeds and trouble swallowing.   Eyes: Negative for pain and visual disturbance.  Respiratory: Negative for cough, shortness of breath and wheezing.   Cardiovascular:  Negative for chest pain, palpitations and leg swelling.  Gastrointestinal: Negative for nausea, vomiting and abdominal pain.       No changes in bowel habits.  Endocrine: Negative for cold intolerance and heat intolerance.  Genitourinary: Negative for dysuria, frequency and decreased urine volume.  Musculoskeletal: Negative for myalgias and gait problem.  Skin: Negative for rash.  Neurological: Negative for syncope, weakness and headaches.  Psychiatric/Behavioral: Positive for sleep disturbance. Negative for behavioral problems and confusion. The patient is nervous/anxious.       No current outpatient prescriptions on file prior to visit.   No current facility-administered medications on file prior to visit.     History reviewed. No pertinent past medical history. Allergies  Allergen Reactions  . Other Other (See Comments)    "pain medications"    History reviewed. No pertinent family history.  Social History   Social History  . Marital Status: Married    Spouse Name: Martha Johnston  . Number of Children: 2  . Years of Education: BS   Occupational History  . Housewife Other   Social History Main Topics  . Smoking status: Never Smoker   . Smokeless tobacco: Never Used  . Alcohol Use: No  . Drug Use: No  . Sexual Activity: Not Asked   Other Topics Concern  . None   Social History Narrative    Filed Vitals:   02/03/16 1517  BP: 146/84  Pulse: 87  Temp: 98.3 F (36.8 C)  Resp: 12    Body mass index is 24.04 kg/(m^2).    Physical Exam  Nursing note and vitals reviewed. Constitutional:  She is oriented to person, place, and time. She appears well-developed and well-nourished. No distress.  HENT:  Head: Atraumatic.  Mouth/Throat: Oropharynx is clear and moist and mucous membranes are normal.  Eyes: Conjunctivae and EOM are normal. Pupils are equal, round, and reactive to light.  Neck: No thyromegaly present.  Cardiovascular: Normal rate and regular rhythm.     No murmur heard. Pulses:      Dorsalis pedis pulses are 2+ on the right side, and 2+ on the left side.  Respiratory: Effort normal and breath sounds normal. No respiratory distress.  GI: Soft. She exhibits no mass. There is no tenderness.  Musculoskeletal: She exhibits no edema or tenderness.  Lymphadenopathy:    She has no cervical adenopathy.  Neurological: She is alert and oriented to person, place, and time. She has normal strength. Coordination and gait normal.  Skin: Skin is warm. No erythema.  Psychiatric: Her speech is normal. Her mood appears anxious.  Well groomed, good eye contact.      ASSESSMENT AND PLAN:    Martha Johnston was seen today for establish care and anxiety.  Diagnoses and all orders for this visit:  ELEVATED BLOOD PRESSURE WITHOUT DIAGNOSIS OF HYPERTENSION  Re-checked 145/80. Possible complications of elevated BP discussed. Monitor BP at home. Will continue following.  Anxiety disorder, unspecified  Some side effects of SSRI's discussed. Keep appt with counselor, planning on 02/2016. Instructed about warning signs. Also some dietary changes and daily probiotic that have shown positive benefits on some observational studies discussed. F/U in 6-8 weeks, before if needed.  -     FLUoxetine (PROZAC) 20 MG tablet; Take 1 tablet (20 mg total) by mouth daily.         Martha Johnston G. [place], MD  [facility]. [place] office.

## 2016-04-29 ENCOUNTER — Other Ambulatory Visit: Payer: Self-pay | Admitting: Obstetrics and Gynecology

## 2016-04-29 DIAGNOSIS — N6310 Unspecified lump in the right breast, unspecified quadrant: Secondary | ICD-10-CM

## 2016-05-04 ENCOUNTER — Other Ambulatory Visit: Payer: Self-pay | Admitting: Obstetrics and Gynecology

## 2016-05-04 ENCOUNTER — Ambulatory Visit
Admission: RE | Admit: 2016-05-04 | Discharge: 2016-05-04 | Disposition: A | Discharge status: Home / Self Care | Payer: BLUE CROSS/BLUE SHIELD | Source: Ambulatory Visit | Attending: Obstetrics and Gynecology | Admitting: Obstetrics and Gynecology

## 2016-05-04 DIAGNOSIS — N6310 Unspecified lump in the right breast, unspecified quadrant: Secondary | ICD-10-CM

## 2016-05-05 ENCOUNTER — Other Ambulatory Visit: Payer: Self-pay | Admitting: Obstetrics and Gynecology

## 2016-05-05 DIAGNOSIS — N6310 Unspecified lump in the right breast, unspecified quadrant: Secondary | ICD-10-CM

## 2016-05-06 ENCOUNTER — Ambulatory Visit
Admission: RE | Admit: 2016-05-06 | Discharge: 2016-05-06 | Disposition: A | Discharge status: Home / Self Care | Payer: BLUE CROSS/BLUE SHIELD | Source: Ambulatory Visit | Attending: Obstetrics and Gynecology | Admitting: Obstetrics and Gynecology

## 2016-05-06 DIAGNOSIS — N6310 Unspecified lump in the right breast, unspecified quadrant: Secondary | ICD-10-CM

## 2016-06-11 ENCOUNTER — Other Ambulatory Visit: Payer: Self-pay | Admitting: General Surgery

## 2017-01-01 ENCOUNTER — Telehealth: Payer: Self-pay | Admitting: Family Medicine

## 2017-01-01 NOTE — Telephone Encounter (Signed)
Pt would like to see if you would [facility] due to the fluoxetine has caused her to gain.   Pharm:  [facility] [address]  Pt would to have her TSH checked.

## 2017-01-04 NOTE — Telephone Encounter (Signed)
Can we get her in for an appointment please? Thank you!

## 2017-01-04 NOTE — Telephone Encounter (Signed)
She was last seen 01/2016, she was supposed to have 1-2 months follow up then.  Please arrange appt. Thanks, BJ

## 2017-01-05 NOTE — Progress Notes (Signed)
HPI:   MarthaMartha Johnston is a 46 y.o. female, who is here today to follow on some chronic medical problems.  She was last seen on 02/03/16, when she was c/o anxiety. Fluoxetine was recommended, which she took for 2-3 months, discontinued because wt gain. Resumed medication about 10 days ago but she would like to try a different medication. Fluoxetine caused some "gassy" feeling when fists started but resolved after a few days.  Her daughter is on Lexapro,she would like to try it.  She denies suicidal thoughts. Sleeps well but doe snot feel rested.   -BP has also ben elevated in the past.  BP readings at home: She is not checking BP's. She follows a healthy diet and exercises regularly.   Concerns today:   She would like to have thyroid check. Her daughter recently checked for the second time and TSH around 10, so started on treatment. She is concerned about having thyroid issues because has noted dry hair and worsening fatigue. S/P partial thyroidectomy in 1998, benign nodule.  She denies heavy menses.  She has not been able to lose wt despite increasing exercise frequency and intensity.   Review of Systems  Constitutional: Positive for fatigue. Negative for appetite change, chills, fever and unexpected weight change.  HENT: Negative for mouth sores and nosebleeds.   Eyes: Negative for redness and visual disturbance.  Respiratory: Negative for shortness of breath and wheezing.   Cardiovascular: Negative for chest pain, palpitations and leg swelling.  Gastrointestinal: Negative for abdominal pain, nausea and vomiting.       No changes in bowel habits.  Endocrine: Negative for cold intolerance and heat intolerance.  Musculoskeletal: Negative for gait problem and myalgias.  Skin: Negative for rash.  Neurological: Negative for tremors, weakness and headaches.  Hematological: Negative for adenopathy. Does not bruise/bleed easily.  Psychiatric/Behavioral: Negative for  confusion, hallucinations, sleep disturbance and suicidal ideas. The patient is nervous/anxious.       No current outpatient prescriptions on file prior to visit.   No current facility-administered medications on file prior to visit.      No past medical history on file. Allergies  Allergen Reactions  . Other Other (See Comments)    "pain medications"    Social History   Social History  . Marital status: Married    Spouse name: Greig Castilla  . Number of children: 2  . Years of education: BS   Occupational History  . Housewife Other   Social History Main Topics  . Smoking status: Never Smoker  . Smokeless tobacco: Never Used  . Alcohol use No  . Drug use: No  . Sexual activity: Not Asked   Other Topics Concern  . None   Social History Narrative  . None    Vitals:   01/06/17 1023  BP: 110/80  Pulse: 64  Resp: 12   Body mass index is 24.59 kg/m.   Physical Exam  Nursing note and vitals reviewed. Constitutional: She is oriented to person, place, and time. She appears well-developed and well-nourished. No distress.  HENT:  Head: Atraumatic.  Mouth/Throat: Oropharynx is clear and moist and mucous membranes are normal.  Eyes: Conjunctivae and EOM are normal. Pupils are equal, round, and reactive to light.  Neck: No tracheal deviation present. No thyroid mass and no thyromegaly present.  Cardiovascular: Normal rate and regular rhythm.   No murmur heard. Pulses:      Dorsalis pedis pulses are 2+ on the right side, and  2+ on the left side.  Respiratory: Effort normal and breath sounds normal. No respiratory distress.  GI: Soft. She exhibits no mass. There is no tenderness.  Musculoskeletal: She exhibits no edema.  Lymphadenopathy:    She has no cervical adenopathy.  Neurological: She is alert and oriented to person, place, and time. She has normal strength. Gait normal.  Skin: Skin is warm. No erythema.  Psychiatric: Her mood appears anxious.  Well groomed,  good eye contact.    ASSESSMENT AND PLAN:   Mellissa Johnston was seen today for follow-up.  Diagnoses and all orders for this visit:  Lab Results  Component Value Date   TSH 1.72 01/06/2017    Other fatigue  We discussed possible etiologies, including some systemic and psychiatric disorders. Continue regular physical activity and a healthy diet. Further recommendations will be given according to lab results. Will consider further work up if still symptomatic after starting Lexapro.  She has had labs done at her gyn office annually, I do not have reports.   H/O partial thyroidectomy  -     TSH -     T3, Free -     T4, Free  Anxiety disorder, unspecified type  We discussed some side effects of Lexapro, she was instructed to let me know about tolerance I  4-6 weeks. Instructed about warning signs. Medication may help with fatigue. F/U in 3-4 months.   -     escitalopram (LEXAPRO) 10 MG tablet; Take 1 tablet (10 mg total) by mouth daily.      -Martha Johnston was advised to return sooner than planned today if new concerns arise.       Larcenia Holaday G. [place], MD  [facility]. [place] office.

## 2017-01-05 NOTE — Telephone Encounter (Signed)
Pt has been sch

## 2017-01-06 ENCOUNTER — Encounter: Payer: Self-pay | Admitting: Family Medicine

## 2017-01-06 ENCOUNTER — Ambulatory Visit (INDEPENDENT_AMBULATORY_CARE_PROVIDER_SITE_OTHER): Payer: BLUE CROSS/BLUE SHIELD | Admitting: Family Medicine

## 2017-01-06 VITALS — BP 110/80 | HR 64 | Resp 12 | Ht 64.5 in | Wt 145.5 lb

## 2017-01-06 DIAGNOSIS — R5383 Other fatigue: Secondary | ICD-10-CM | POA: Diagnosis not present

## 2017-01-06 DIAGNOSIS — Z9009 Acquired absence of other part of head and neck: Secondary | ICD-10-CM

## 2017-01-06 DIAGNOSIS — E890 Postprocedural hypothyroidism: Secondary | ICD-10-CM | POA: Diagnosis not present

## 2017-01-06 DIAGNOSIS — F419 Anxiety disorder, unspecified: Secondary | ICD-10-CM | POA: Diagnosis not present

## 2017-01-06 LAB — T3, FREE: T3, Free: 2.8 pg/mL (ref 2.3–4.2)

## 2017-01-06 LAB — TSH: TSH: 1.72 u[IU]/mL (ref 0.35–4.50)

## 2017-01-06 LAB — T4, FREE: Free T4: 0.74 ng/dL (ref 0.60–1.60)

## 2017-01-06 MED ORDER — ESCITALOPRAM OXALATE 10 MG PO TABS: 10.0000 mg | ORAL_TABLET | Freq: Every day | ORAL | 2 refills | Status: DC

## 2017-01-06 NOTE — Patient Instructions (Signed)
A few things to remember from today's visit:   Anxiety disorder, unspecified type - Plan: escitalopram (LEXAPRO) 10 MG tablet  H/O partial thyroidectomy - Plan: TSH  Today we started Lexapro, this type of medications can increase suicidal risk. This is more prevalent among children,adolecents, and young adults with major depression or other psychiatric disorders. It can also make depression worse. Most common side effects are gastrointestinal, self limited after a few weeks: diarrhea, nausea, constipation  Or diarrhea among some.  In general it is well tolerated. We will follow closely.  In about 4-6 weeks please let me know through My Chart or by calling the office about tolerance of new medication.      Please be sure medication list is accurate. If a new problem present, please set up appointment sooner than planned today.

## 2017-01-13 ENCOUNTER — Ambulatory Visit (INDEPENDENT_AMBULATORY_CARE_PROVIDER_SITE_OTHER): Payer: BLUE CROSS/BLUE SHIELD | Admitting: Family Medicine

## 2017-01-13 ENCOUNTER — Encounter: Payer: Self-pay | Admitting: Family Medicine

## 2017-01-13 VITALS — BP 142/98 | HR 58 | Temp 99.0°F | Wt 145.7 lb

## 2017-01-13 DIAGNOSIS — J029 Acute pharyngitis, unspecified: Secondary | ICD-10-CM | POA: Diagnosis not present

## 2017-01-13 DIAGNOSIS — R030 Elevated blood-pressure reading, without diagnosis of hypertension: Secondary | ICD-10-CM

## 2017-01-13 LAB — POCT RAPID STREP A (OFFICE): Rapid Strep A Screen: NEGATIVE

## 2017-01-13 NOTE — Patient Instructions (Addendum)
Pharyngitis Pharyngitis is redness, pain, and swelling (inflammation) of your pharynx. What are the causes? Pharyngitis is usually caused by infection. Most of the time, these infections are from viruses (viral) and are part of a cold. However, sometimes pharyngitis is caused by bacteria (bacterial). Pharyngitis can also be caused by allergies. Viral pharyngitis may be spread from person to person by coughing, sneezing, and personal items or utensils (cups, forks, spoons, toothbrushes). Bacterial pharyngitis may be spread from person to person by more intimate contact, such as kissing. What are the signs or symptoms? Symptoms of pharyngitis include:  Sore throat.  Tiredness (fatigue).  Low-grade fever.  Headache.  Joint pain and muscle aches.  Skin rashes.  Swollen lymph nodes.  Plaque-like film on throat or tonsils (often seen with bacterial pharyngitis). How is this diagnosed? Your health care provider will ask you questions about your illness and your symptoms. Your medical history, along with a physical exam, is often all that is needed to diagnose pharyngitis. Sometimes, a rapid strep test is done. Other lab tests may also be done, depending on the suspected cause. How is this treated? Viral pharyngitis will usually get better in 3-4 days without the use of medicine. Bacterial pharyngitis is treated with medicines that kill germs (antibiotics). Follow these instructions at home:  Drink enough water and fluids to keep your urine clear or pale yellow.  Only take over-the-counter or prescription medicines as directed by your health care provider:  If you are prescribed antibiotics, make sure you finish them even if you start to feel better.  Do not take aspirin.  Get lots of rest.  Gargle with 8 oz of salt water ( tsp of salt per 1 qt of water) as often as every 1-2 hours to soothe your throat.  Throat lozenges (if you are not at risk for choking) or sprays may be used to  soothe your throat. Contact a health care provider if:  You have large, tender lumps in your neck.  You have a rash.  You cough up green, yellow-brown, or bloody spit. Get help right away if:  Your neck becomes stiff.  You drool or are unable to swallow liquids.  You vomit or are unable to keep medicines or liquids down.  You have severe pain that does not go away with the use of recommended medicines.  You have trouble breathing (not caused by a stuffy nose). This information is not intended to replace advice given to you by your health care provider. Make sure you discuss any questions you have with your health care provider. Document Released: 08/10/2005 Document Revised: 01/16/2016 Document Reviewed: 04/17/2013 Elsevier Interactive Patient Education  2017 Elsevier Inc.  Monitor blood pressure and be in touch if consistently > 140/90

## 2017-01-13 NOTE — Progress Notes (Signed)
Subjective:     Patient ID: Martha Johnston, female   DOB: 10/03/1970, 46 y.o.   MRN: [id number]  HPI Patient seen as a work in with sore throat. Onset Sunday. Her 2 children had strep throat last week. She has not had any fever. She's had occasional headaches and some general fatigue. Some postnasal drip and nasal congestion. No cough. No body aches. No skin rash. No adenopathy. No history of frequent strep. No nausea or vomiting.  No past medical history on file. No past surgical history on file.  reports that she has never smoked. She has never used smokeless tobacco. She reports that she does not drink alcohol or use drugs. family history is not on file. Allergies  Allergen Reactions  . Other Other (See Comments)    "pain medications"     Review of Systems  Constitutional: Negative for chills and fever.  HENT: Positive for sore throat.   Respiratory: Negative for cough.   Skin: Negative for rash.       Objective:   Physical Exam  Constitutional: She appears well-developed and well-nourished.  HENT:  Right Ear: External ear normal.  Left Ear: External ear normal.  Minimal posterior erythema. No exudate.  Neck: Neck supple.  Cardiovascular: Normal rate and regular rhythm.   Pulmonary/Chest: Effort normal and breath sounds normal. No respiratory distress. She has no wheezes. She has no rales.  Lymphadenopathy:    She has no cervical adenopathy.  Skin: No rash noted.       Assessment:     Acute pharyngitis. Rapid strep negative. Even though she had exposure she has had no fever no anterior cervical adenopathy and the presence of nasal congestion make all this more likely viral .    Plan:     Treat symptomatically She is advised to follow-up with primary within the next few months to reassess blood pressure which is slightly elevated today. She is also will consider getting home blood pressure cuff to monitor at home  Kristian CoveyBruce W Judee Hennick MD [facility]  [facility]

## 2017-04-02 ENCOUNTER — Telehealth: Payer: Self-pay | Admitting: Family Medicine

## 2017-04-02 NOTE — Telephone Encounter (Signed)
Pt would like to go back to generic prozac. [facility] [address]

## 2017-04-06 ENCOUNTER — Other Ambulatory Visit: Payer: Self-pay | Admitting: Family Medicine

## 2017-04-06 DIAGNOSIS — F419 Anxiety disorder, unspecified: Secondary | ICD-10-CM

## 2017-04-06 MED ORDER — FLUOXETINE HCL 20 MG PO TABS: 20.0000 mg | ORAL_TABLET | Freq: Every day | ORAL | 1 refills | Status: DC

## 2017-04-06 NOTE — Telephone Encounter (Signed)
Rx for Fluoxetine 20 mg sent. F/U appt in 2 months,before if needed.  Thanks, BJ

## 2017-04-13 ENCOUNTER — Other Ambulatory Visit: Payer: Self-pay

## 2017-04-13 MED ORDER — FLUOXETINE HCL 20 MG PO CAPS: 20.0000 mg | ORAL_CAPSULE | Freq: Every day | ORAL | 1 refills | Status: DC

## 2017-05-09 NOTE — Progress Notes (Signed)
HPI:   MarthaMartha Johnston is a 46 y.o. female, who is here today for 4 months follow-up.  Last visit was on 01/06/2017, which was complaining of fatigue.  She is concerned about weight gain despite of regular exercise and healthy diet. She drinks at least 3 beers during weekends. States that she has a healthy diet during the week and eats what she wants during weekends. She also increased physical activity.   Thyroid workup has been negative. Lab Results  Component Value Date   TSH 1.72 01/06/2017     Anxiety: Years Hx. Exacerbated by social settings. She has been on Celexa but did not help much. Prozac was started on 02/02/17 bur she discontinued after 2-3 months because noted wt gain. She was started on Lexapro 01/06/17.  On 04/02/17 she requested to be back on Prozac.according to patient,one of her friends, who is pharmacist, recommended going back to Prozac because is less likely to cause weight gain among all SSRIs.  Currently she is on Prozac 20 mg daily.  She denies any problem with sleep, sleeping about 7-8 hours.  She denies depression or suicidal thoughts.   Review of Systems  Constitutional: Positive for fatigue (no more than usual.). Negative for activity change, appetite change and unexpected weight change.  Respiratory: Negative for chest tightness, shortness of breath and wheezing.   Cardiovascular: Negative for chest pain, palpitations and leg swelling.  Gastrointestinal: Negative for abdominal pain, diarrhea, nausea and vomiting.  Endocrine: Negative for cold intolerance and heat intolerance.  Musculoskeletal: Negative for gait problem and myalgias.  Skin: Negative for pallor and rash.  Neurological: Negative for seizures, weakness and headaches.  Psychiatric/Behavioral: Negative for confusion, hallucinations, sleep disturbance and suicidal ideas. The patient is nervous/anxious.       Current Outpatient Prescriptions on File Prior to Visit    Medication Sig Dispense Refill  . valACYclovir (VALTREX) 1000 MG tablet Take 1,000 mg by mouth as needed.      No current facility-administered medications on file prior to visit.      No past medical history on file. Allergies  Allergen Reactions  . Other Other (See Comments)    "pain medications"    Social History   Social History  . Marital status: Married    Spouse name: Martha Johnston  . Number of children: 2  . Years of education: BS   Occupational History  . Housewife Other   Social History Main Topics  . Smoking status: Never Smoker  . Smokeless tobacco: Never Used  . Alcohol use No  . Drug use: No  . Sexual activity: Not Asked   Other Topics Concern  . None   Social History Narrative  . None    Vitals:   05/10/17 0822  BP: 118/70  Pulse: 80  SpO2: 98%   Body mass index is 25.56 kg/m.   Wt Readings from Last 3 Encounters:  05/10/17 151 lb 4 oz (68.6 kg)  01/13/17 145 lb 11.2 oz (66.1 kg)  01/06/17 145 lb 8 oz (66 kg)     Physical Exam  Constitutional: She is oriented to person, place, and time. She appears well-developed. No distress.  HENT:  Mouth/Throat: Oropharynx is clear and moist and mucous membranes are normal.  Eyes: Pupils are equal, round, and reactive to light. Conjunctivae are normal.  Neck: No tracheal deviation present. No thyroid mass and no thyromegaly present.  Cardiovascular: Normal rate and regular rhythm.   No murmur heard. Respiratory: Effort normal  and breath sounds normal. No respiratory distress.  Musculoskeletal: She exhibits no edema or tenderness.  Lymphadenopathy:    She has no cervical adenopathy.  Neurological: She is alert and oriented to person, place, and time. She has normal strength. Coordination and gait normal.  Skin: Skin is warm. No rash noted. No erythema.  Psychiatric: Her speech is normal. Her mood appears anxious. Cognition and memory are normal. She expresses no suicidal ideation. She expresses no  suicidal plans.  Well groomed, good eye contact.    ASSESSMENT AND PLAN:    [name] was seen today for follow-up.  Diagnoses and all orders for this visit:  Anxiety disorder, unspecified type  Improved. She is tolerating medication well. We discussed side effects as well as other treatment options. She would like to continue Prozac.  Instructed about warning signs. Follow-up in 6 months (CPE)  -     FLUoxetine (PROZAC) 20 MG capsule; Take 1 capsule (20 mg total) by mouth daily.  Overweight (BMI 25.0-29.9)  Frustrated about wt gain. Has gained about 6 lb since her last OV in 12/2016. We discussed causes of wt gain, including hormonal,aging,and calorie intake. Recommended continuing regular physical activity and a healthy diet, including weekends.   25 min face to face OV. > 50% was dedicated to discussion of Dx, prognosis, other treatment options, and side effects of medications. We also spend extra time discussing dietary habits and counseling about calorie count, including weekends. She agrees with stopping beer for 4 weeks and monitor for wt changes.    -Martha Johnston was advised to return sooner than planned today if new concerns arise.       Martha G. [place], MD  [facility]. [place] office.

## 2017-05-10 ENCOUNTER — Encounter: Payer: Self-pay | Admitting: Family Medicine

## 2017-05-10 ENCOUNTER — Ambulatory Visit (INDEPENDENT_AMBULATORY_CARE_PROVIDER_SITE_OTHER): Payer: BLUE CROSS/BLUE SHIELD | Admitting: Family Medicine

## 2017-05-10 VITALS — BP 118/70 | HR 80 | Ht 64.5 in | Wt 151.2 lb

## 2017-05-10 DIAGNOSIS — F419 Anxiety disorder, unspecified: Secondary | ICD-10-CM

## 2017-05-10 DIAGNOSIS — E663 Overweight: Secondary | ICD-10-CM | POA: Diagnosis not present

## 2017-05-10 MED ORDER — FLUOXETINE HCL 20 MG PO CAPS: 20.0000 mg | ORAL_CAPSULE | Freq: Every day | ORAL | 2 refills | Status: DC

## 2017-05-10 NOTE — Patient Instructions (Addendum)
A few things to remember from today's visit:   Anxiety disorder, unspecified type - Plan: FLUoxetine (PROZAC) 20 MG capsule   Martha Johnston, today we have followed on some of your chronic medical problems and they seem to be stable, so no changes in current management today.  Review medication list and be sure it is accurate.  -Remember a healthy diet and regular physical activity are very important for prevention as well as for well being; they also help with many chronic problems, decreasing the need of adding new medications and delaying or preventing possible complications.  I will see you back in 6 months.  Remember to arrange your follow up appt before leaving today.  Please follow sooner than planned if a new concern arises.   Please be sure medication list is accurate. If a new problem present, please set up appointment sooner than planned today.

## 2017-11-08 ENCOUNTER — Encounter: Payer: BLUE CROSS/BLUE SHIELD | Admitting: Family Medicine

## 2018-01-27 ENCOUNTER — Encounter: Payer: Self-pay | Admitting: Obstetrics & Gynecology

## 2018-01-27 ENCOUNTER — Ambulatory Visit: Payer: BLUE CROSS/BLUE SHIELD | Admitting: Obstetrics & Gynecology

## 2018-01-27 VITALS — Ht 64.0 in | Wt 148.0 lb

## 2018-01-27 DIAGNOSIS — Z3009 Encounter for other general counseling and advice on contraception: Secondary | ICD-10-CM

## 2018-01-27 DIAGNOSIS — Z30432 Encounter for removal of intrauterine contraceptive device: Secondary | ICD-10-CM | POA: Diagnosis not present

## 2018-01-27 NOTE — Patient Instructions (Signed)
1. Encounter for IUD removal Easy Mirena IUD removal after more than 5 years.  Mirena intact and complete.  Patient has used condom back-up for contraception.  2. Encounter for other general counseling or advice on contraception Will continue with condoms at this point.  Adding spermicides recommended.  Mirena versus ParaGard (Copper) IUD reviewed.  Patient will verify insurance coverage and schedule an appointment for insertion as soon as possible.  Reola, it was a pleasure seeing you today!

## 2018-01-27 NOTE — Progress Notes (Signed)
    Martha Johnston [name] 05/13/1971 [id number]        47 y.o.  G2P2L2 Married  RP: Mirena IUD x >5 years for removal  HPI: Using condoms as back-up since reached 5 yrs after Mirena IUD insertion.  Light flow menses.  No pelvic pain.   OB History  Gravida Para Term Preterm AB Living  2 2       2   SAB TAB Ectopic Multiple Live Births               # Outcome Date GA Lbr Len/2nd Weight Sex Delivery Anes PTL Lv  2 Para           1 Para             Past medical history,surgical history, problem list, medications, allergies, family history and social history were all reviewed and documented in the EPIC chart.   Directed ROS with pertinent positives and negatives documented in the history of present illness/assessment and plan.  Exam:  Vitals:   01/27/18 1203  Weight: 148 lb (67.1 kg)  Height: 5\' 4"  (1.626 m)   General appearance:  Normal  Abdomen: Normal  Gynecologic exam: Vulva normal.  Speculum:  Cervix/Vagina normal.  Normal secretions.  IUD strings short, but visible.  Verbal consent for IUD removal obtained.  Easy removal of IUD by pulling on strings with a Boseman clamp.  IUD intact, complete.  Shown to patient and then discarded.   Assessment/Plan:  47 y.o. G2P2   1. Encounter for IUD removal Easy Mirena IUD removal after more than 5 years.  Mirena intact and complete.  Patient has used condom back-up for contraception.  2. Encounter for other general counseling or advice on contraception Will continue with condoms at this point.  Adding spermicides recommended.  Mirena versus ParaGard (Copper) IUD reviewed.  Patient will verify insurance coverage and schedule an appointment for insertion as soon as possible.  Counseling on above issues and coordination of care more than 50% for 15 minutes.  Genia DelMarie-Lyne Irianna Gilday MD, 12:12 PM 01/27/2018

## 2018-03-21 ENCOUNTER — Encounter: Payer: Self-pay | Admitting: Obstetrics & Gynecology

## 2018-03-21 ENCOUNTER — Ambulatory Visit (INDEPENDENT_AMBULATORY_CARE_PROVIDER_SITE_OTHER): Payer: BLUE CROSS/BLUE SHIELD | Admitting: Obstetrics & Gynecology

## 2018-03-21 ENCOUNTER — Ambulatory Visit: Payer: BLUE CROSS/BLUE SHIELD | Admitting: Internal Medicine

## 2018-03-21 VITALS — BP 138/86

## 2018-03-21 DIAGNOSIS — Z30430 Encounter for insertion of intrauterine contraceptive device: Secondary | ICD-10-CM

## 2018-03-21 DIAGNOSIS — H10022 Other mucopurulent conjunctivitis, left eye: Secondary | ICD-10-CM | POA: Diagnosis not present

## 2018-03-21 LAB — PREGNANCY, URINE: PREG TEST UR: NEGATIVE

## 2018-03-21 MED ORDER — TOBRAMYCIN-DEXAMETHASONE 0.3-0.1 % OP SUSP: 1.0000 [drp] | OPHTHALMIC | 0 refills | Status: DC

## 2018-03-21 NOTE — Progress Notes (Deleted)
   No chief complaint on file.   HPI: Martha Johnston 47 y.o.  SDA PCP NA  ROS: See pertinent positives and negatives per HPI.  No past medical history on file.  Family History  Problem Relation Age of Onset  . Breast cancer Mother   . Cancer Mother        skin  . Hypertension Mother   . Hypertension Father     Social History   Socioeconomic History  . Marital status: Married    Spouse name: Greig Castillandrew  . Number of children: 2  . Years of education: BS  . Highest education level: Not on file  Occupational History  . Occupation: Engineer, siteHousewife    Employer: OTHER  Social Needs  . Financial resource strain: Not on file  . Food insecurity:    Worry: Not on file    Inability: Not on file  . Transportation needs:    Medical: Not on file    Non-medical: Not on file  Tobacco Use  . Smoking status: Never Smoker  . Smokeless tobacco: Never Used  Substance and Sexual Activity  . Alcohol use: Yes    Alcohol/week: 0.0 oz    Comment: 2 DRINKS A WEEK   . Drug use: No  . Sexual activity: Yes    Partners: Male    Comment: 1st intercourse- [id number], partners- , married- 19 yrs   Lifestyle  . Physical activity:    Days per week: Not on file    Minutes per session: Not on file  . Stress: Not on file  Relationships  . Social connections:    Talks on phone: Not on file    Gets together: Not on file    Attends religious service: Not on file    Active member of club or organization: Not on file    Attends meetings of clubs or organizations: Not on file    Relationship status: Not on file  Other Topics Concern  . Not on file  Social History Narrative  . Not on file    No outpatient medications prior to visit.   No facility-administered medications prior to visit.      EXAM:  LMP 03/07/2018   There is no height or weight on file to calculate BMI.  GENERAL: vitals reviewed and listed above, alert, oriented, appears well hydrated and in no acute distress HEENT: atraumatic,  conjunctiva  clear, no obvious abnormalities on inspection of external nose and ears OP : no lesion edema or exudate  NECK: no obvious masses on inspection palpation  LUNGS: clear to auscultation bilaterally, no wheezes, rales or rhonchi, good air movement CV: HRRR, no clubbing cyanosis or  peripheral edema nl cap refill  MS: moves all extremities without noticeable focal  abnormality PSYCH: pleasant and cooperative, no obvious depression or anxiety  ASSESSMENT AND PLAN:  Discussed the following assessment and plan:  No diagnosis found.  -Patient advised to return or notify health care team  if symptoms worsen ,persist or new concerns arise.  There are no Patient Instructions on file for this visit.   Neta MendsWanda K. Daleisa Halperin M.D.

## 2018-03-21 NOTE — Progress Notes (Signed)
    Martha Johnston 05/25/1971 [id number]        47 y.o.  G2P2L2 Married  RP: Mirena IUD Insertion  HPI: Mirena IUD removed after >5 yrs last visit 01/27/2018.  Normal menstrual period 03/07/2018.  Using condoms since Mirena removal.  No pelvic pain.  Normal vaginal secretions.  Patient complains of a pink left eye.  Mild secretion at the left eye.  No ocular pain.  No fever.   OB History  Gravida Para Term Preterm AB Living  2 2       2   SAB TAB Ectopic Multiple Live Births               # Outcome Date GA Lbr Len/2nd Weight Sex Delivery Anes PTL Lv  2 Para           1 Para             Past medical history,surgical history, problem list, medications, allergies, family history and social history were all reviewed and documented in the EPIC chart.   Directed ROS with pertinent positives and negatives documented in the history of present illness/assessment and plan.  Exam:  Vitals:   03/21/18 1211  BP: 138/86   General appearance:  Normal                                                                    IUD procedure note       Patient presented to the office today for placement of Mirena IUD. The patient had previously been provided with literature information on this method of contraception. The risks benefits and pros and cons were discussed and all her questions were answered. She is fully aware that this form of contraception is 99% effective and is good for 5 years.  UPT negative today  Pelvic exam: Vulva normal Vagina: No lesions or discharge Cervix: No lesions or discharge Uterus: AV position Adnexa: No masses or tenderness Rectal exam: Not done  The cervix was cleansed with Betadine solution. Hurricane spray on the cervix.  A single-tooth tenaculum was placed on the anterior cervical lip. The IUD was shown to the patient and inserted in a sterile fashion.  Hysterometry with the IUD as being inserted was 7 cm.  The IUD string was trimmed. The single-tooth tenaculum was  removed. Patient was instructed to return back to the office in one month for follow up.        Assessment/Plan:  47 y.o. G2P2   1. Encounter for IUD insertion No unprotected intercourse.  Urine pregnancy test negative today.  Easy insertion of Mirena IUD.  Well-tolerated by patient.  No complication.  Follow-up in 4 weeks for IUD check. - Pregnancy, urine  2. Pink eye disease of left eye Prescription for tobramycin dexamethasone ophthalmic solution sent to pharmacy.  Patient will consult with her family physician if no improvement after 24 hours on the eye drop.  Other orders - tobramycin-dexamethasone (TOBRADEX) ophthalmic solution; Place 1 drop into the left eye every 4 (four) hours while awake.  Counseling on above issues and coordination of care more than 50% for 15 minutes.  Genia DelMarie-Lyne Jayquan Bradsher MD, 12:35 PM 03/21/2018

## 2018-03-22 ENCOUNTER — Telehealth: Payer: Self-pay

## 2018-03-22 NOTE — Telephone Encounter (Signed)
Note sent Re: Tobramycin-Dexamethasone Opth Solution   "Cost is $83.00. Patient would like regular Tobramycin eye drops 0.3% eye drops as cost is just $8.00."  Ok to change Rx to plan Tobramycin eye drops?

## 2018-03-23 ENCOUNTER — Encounter: Payer: Self-pay | Admitting: Obstetrics & Gynecology

## 2018-03-23 ENCOUNTER — Encounter: Payer: Self-pay | Admitting: Anesthesiology

## 2018-03-23 MED ORDER — TOBRAMYCIN 0.3 % OP SOLN: 1.0000 [drp] | OPHTHALMIC | 0 refills | Status: DC

## 2018-03-23 NOTE — Addendum Note (Signed)
Addended by: Keenan BachelorANNAS, Luwana Butrick R on: 03/23/2018 02:47 PM   Modules accepted: Orders

## 2018-03-23 NOTE — Telephone Encounter (Signed)
Yes, can change to Tobramycin 0.3%.

## 2018-03-23 NOTE — Telephone Encounter (Signed)
New Rx sent. Pharmacy note added to cancel the other Rx.

## 2018-03-23 NOTE — Patient Instructions (Signed)
1. Encounter for IUD insertion No unprotected intercourse.  Urine pregnancy test negative today.  Easy insertion of Mirena IUD.  Well-tolerated by patient.  No complication.  Follow-up in 4 weeks for IUD check. - Pregnancy, urine  2. Pink eye disease of left eye Prescription for tobramycin dexamethasone ophthalmic solution sent to pharmacy.  Patient will consult with her family physician if no improvement after 24 hours on the eye drop.  Other orders - tobramycin-dexamethasone (TOBRADEX) ophthalmic solution; Place 1 drop into the left eye every 4 (four) hours while awake.  Padme, it was a pleasure seeing you today!

## 2018-04-22 ENCOUNTER — Encounter: Payer: Self-pay | Admitting: Obstetrics & Gynecology

## 2018-04-22 ENCOUNTER — Ambulatory Visit: Payer: BLUE CROSS/BLUE SHIELD | Admitting: Obstetrics & Gynecology

## 2018-04-22 VITALS — BP 118/76

## 2018-04-22 DIAGNOSIS — Z30431 Encounter for routine checking of intrauterine contraceptive device: Secondary | ICD-10-CM

## 2018-04-22 NOTE — Progress Notes (Signed)
    Martha Johnston [name] 03/29/1971 [id number]        47 y.o.  G2P2   RP: Mirena IUD check post insertion  HPI: Mirena IUD inserted on March 21, 2018.  Had a normal menstrual period.  No pain with intercourse.  No pelvic pain and no bleeding currently.  Normal vaginal secretions.  No fever.   OB History  Gravida Para Term Preterm AB Living  2 2       2   SAB TAB Ectopic Multiple Live Births               # Outcome Date GA Lbr Len/2nd Weight Sex Delivery Anes PTL Lv  2 Para           1 Para             Past medical history,surgical history, problem list, medications, allergies, family history and social history were all reviewed and documented in the EPIC chart.   Directed ROS with pertinent positives and negatives documented in the history of present illness/assessment and plan.  Exam:  Vitals:   04/22/18 0933  BP: 118/76   General appearance:  Normal  Gynecologic exam: Vulva normal.  Speculum: Cervix normal with no inflammation and strings visible.  Normal vagina.   Assessment/Plan:  47 y.o. G2P2   1. IUD check up Mirena IUD well-tolerated, in good position and no sign of infection.  Patient reassured.  Follow-up gynecologic annual exam.  Will give Tdap if due for it at that time.  Martha DelMarie-Lyne Trelon Plush MD, 9:42 AM 04/22/2018

## 2018-04-22 NOTE — Patient Instructions (Signed)
1. IUD check up Mirena IUD well-tolerated, in good position and no sign of infection.  Patient reassured.  Follow-up gynecologic annual exam.  Will give Tdap if due for it at that time.  Denean, good seeing you today!

## 2018-05-09 ENCOUNTER — Encounter: Payer: Self-pay | Admitting: Obstetrics & Gynecology

## 2018-05-31 ENCOUNTER — Encounter: Payer: Self-pay | Admitting: Obstetrics & Gynecology

## 2018-05-31 ENCOUNTER — Ambulatory Visit (INDEPENDENT_AMBULATORY_CARE_PROVIDER_SITE_OTHER): Payer: Self-pay | Admitting: Obstetrics & Gynecology

## 2018-05-31 VITALS — BP 126/84 | Ht 64.0 in | Wt 149.0 lb

## 2018-05-31 DIAGNOSIS — Z30431 Encounter for routine checking of intrauterine contraceptive device: Secondary | ICD-10-CM

## 2018-05-31 DIAGNOSIS — Z01419 Encounter for gynecological examination (general) (routine) without abnormal findings: Secondary | ICD-10-CM

## 2018-05-31 NOTE — Progress Notes (Signed)
GELILA WELL [birth year] [id number]   History:    47 y.o. G2P2L2 Married  RP:  Established patient presenting for annual gyn exam   HPI: Well on Mirena IUD x 02/2018.  No BTB.  No pelvic pain.  No pain with IC.  Vaginal Secretions normal.  Urine/BMs wnl.  Breasts normal.  BMI 25.58.  Fit and healthy nutrition.  Past medical history,surgical history, family history and social history were all reviewed and documented in the EPIC chart.  Gynecologic History Patient's last menstrual period was 05/17/2018. Contraception: Mirena IUD x 02/2018 Last Pap: 2017. Results were: normal Last mammogram: 2017. Results were: normal Bone Density: Never Colonoscopy: Never  Obstetric History OB History  Gravida Para Term Preterm AB Living  2 2       2   SAB TAB Ectopic Multiple Live Births               # Outcome Date GA Lbr Len/2nd Weight Sex Delivery Anes PTL Lv  2 Para           1 Para              ROS: A ROS was performed and pertinent positives and negatives are included in the history.  GENERAL: No fevers or chills. HEENT: No change in vision, no earache, sore throat or sinus congestion. NECK: No pain or stiffness. CARDIOVASCULAR: No chest pain or pressure. No palpitations. PULMONARY: No shortness of breath, cough or wheeze. GASTROINTESTINAL: No abdominal pain, nausea, vomiting or diarrhea, melena or bright red blood per rectum. GENITOURINARY: No urinary frequency, urgency, hesitancy or dysuria. MUSCULOSKELETAL: No joint or muscle pain, no back pain, no recent trauma. DERMATOLOGIC: No rash, no itching, no lesions. ENDOCRINE: No polyuria, polydipsia, no heat or cold intolerance. No recent change in weight. HEMATOLOGICAL: No anemia or easy bruising or bleeding. NEUROLOGIC: No headache, seizures, numbness, tingling or weakness. PSYCHIATRIC: No depression, no loss of interest in normal activity or change in sleep pattern.     Exam:   BP 126/84   Ht 5\' 4"  (1.626 m)   Wt 149 lb (67.6 kg)   LMP  05/17/2018 Comment: MIRENA  BMI 25.58 kg/m   Body mass index is 25.58 kg/m.  General appearance : Well developed well nourished female. No acute distress HEENT: Eyes: no retinal hemorrhage or exudates,  Neck supple, trachea midline, no carotid bruits, no thyroidmegaly Lungs: Clear to auscultation, no rhonchi or wheezes, or rib retractions  Heart: Regular rate and rhythm, no murmurs or gallops Breast:Examined in sitting and supine position were symmetrical in appearance, no palpable masses or tenderness,  no skin retraction, no nipple inversion, no nipple discharge, no skin discoloration, no axillary or supraclavicular lymphadenopathy Abdomen: no palpable masses or tenderness, no rebound or guarding Extremities: no edema or skin discoloration or tenderness  Pelvic: Vulva: Normal             Vagina: No gross lesions or discharge  Cervix: No gross lesions or discharge.  Strings visible.  Pap reflex done  Uterus  AV, normal size, shape and consistency, non-tender and mobile  Adnexa  Without masses or tenderness  Anus: Normal   Assessment/Plan:  47 y.o. female for annual exam   1. Encounter for routine gynecological examination with Papanicolaou smear of cervix Normal gynecologic exam.  Pap reflex done.  Breast exam normal.  Will schedule screening mammogram.  Health labs with family physician.  Good body mass index at 25.58.  Continue with healthy nutrition and fitness.  2. Encounter for routine checking of intrauterine contraceptive device (IUD) Mirena IUD in good position and well-tolerated since July 2019.  Genia Del MD, 12:37 PM 05/31/2018

## 2018-06-02 LAB — PAP IG W/ RFLX HPV ASCU

## 2018-06-05 ENCOUNTER — Encounter: Payer: Self-pay | Admitting: Obstetrics & Gynecology

## 2018-06-05 NOTE — Patient Instructions (Signed)
1. Encounter for routine gynecological examination with Papanicolaou smear of cervix Normal gynecologic exam.  Pap reflex done.  Breast exam normal.  Will schedule screening mammogram.  Health labs with family physician.  Good body mass index at 25.58.  Continue with healthy nutrition and fitness.  2. Encounter for routine checking of intrauterine contraceptive device (IUD) Mirena IUD in good position and well-tolerated since July 2019.  Martha Johnston, it was a pleasure seeing you today!  I will inform you of your results as soon as they are available.

## 2021-03-24 ENCOUNTER — Ambulatory Visit: Payer: BLUE CROSS/BLUE SHIELD | Admitting: Family Medicine

## 2021-12-30 ENCOUNTER — Ambulatory Visit: Payer: 59 | Admitting: Internal Medicine

## 2021-12-30 ENCOUNTER — Encounter: Payer: Self-pay | Admitting: Internal Medicine

## 2021-12-30 VITALS — BP 138/94 | HR 64 | Ht 64.0 in | Wt 150.0 lb

## 2021-12-30 DIAGNOSIS — R002 Palpitations: Secondary | ICD-10-CM

## 2021-12-30 DIAGNOSIS — Z79899 Other long term (current) drug therapy: Secondary | ICD-10-CM | POA: Diagnosis not present

## 2021-12-30 DIAGNOSIS — R030 Elevated blood-pressure reading, without diagnosis of hypertension: Secondary | ICD-10-CM

## 2021-12-30 MED ORDER — AMLODIPINE BESYLATE 5 MG PO TABS: 5.0000 mg | ORAL_TABLET | Freq: Every day | ORAL | 3 refills | Status: DC

## 2021-12-30 NOTE — Patient Instructions (Addendum)
Medication Instructions: AMLODIPINE 5MG  DAILY  ? ?*If you need a refill on your cardiac medications before your next appointment, please call your pharmacy* ? ? ?Lab Work: ?CBC, TSH, CMET, FREE T4, FREE T3, NMR, APO B, LIPO A, URIC ACID, HGBA1C ?If you have labs (blood work) drawn today and your tests are completely normal, you will receive your results only by: ?MyChart Message (if you have MyChart) OR ?A paper copy in the mail ?If you have any lab test that is abnormal or we need to change your treatment, we will call you to review the results. ? ? ?Testing/Procedures: ?NONE ? ? ?Follow-Up: ?At [facility], you and your health needs are our priority.  As part of our continuing mission to provide you with exceptional heart care, we have created designated Provider Care Teams.  These Care Teams include your primary Cardiologist (physician) and Advanced Practice Providers (APPs -  Physician Assistants and Nurse Practitioners) who all work together to provide you with the care you need, when you need it. ? ?We recommend signing up for the patient portal called "MyChart".  Sign up information is provided on this After Visit Summary.  MyChart is used to connect with patients for Virtual Visits (Telemedicine).  Patients are able to view lab/test results, encounter notes, upcoming appointments, etc.  Non-urgent messages can be sent to your provider as well.   ?To learn more about what you can do with MyChart, go to [facility].   ? ?Important Information About Sugar ? ? ? ? ?  ?

## 2021-12-30 NOTE — Progress Notes (Signed)
? ?Cardiology Office Note ? ? ?Date:  12/30/2021  ? ?ID:  Martha Johnston, DOB May 09, 1971, MRN [id number] ? ?PCP:  No primary care provider on file.  ?Cardiologist:   Dietrich Pates, MD  ? ?Patient presents for evaluation of HTN ?  ?History of Present Illness: ?Martha Johnston is a 51 y.o. female with no prior cardiac history    ?This past weekend she checked her BP at home for the first time in a long time    BP was severely elevated   185/108 R arm   163/106 L arm   Repeat 183/ Left and 184 Right      The patient contacted my by phone     ?I recomm starting amlodipine    Initially 2.5 mg daily, with plan to increase to 2.5 mg bid if BP remained high ?Since then the pt's PB has improved but is still high   130s to 150s/ 88 to 110 ? ?The pt has some vague chst discomfort on L side  More positional   No CP with exertion    She is very active   Works out to a big sweat often   Notes no significant change in her abivlity to exercise    ? ? ? ? ? ? ? ? ?Current Meds  ?Medication Sig  ? acetaminophen (TYLENOL) 325 MG tablet Take 650 mg by mouth every 6 (six) hours as needed.  ? amLODipine (NORVASC) 2.5 MG tablet Take 2.5 mg by mouth daily.  ? ? ? ?Allergies:   Other  ? ?History reviewed. No pertinent past medical history. ? ?Past Surgical History:  ?Procedure Laterality Date  ? BREAST SURGERY    ? cyst removed right breast   ? CESAREAN SECTION    ? x2  ? THYROIDECTOMY, PARTIAL    ? ? ? ?Social History:  The patient  reports that she has never smoked. She has never used smokeless tobacco. She reports current alcohol use. She reports that she does not use drugs.  ? ?Family History:  The patient's family history includes Breast cancer in her mother; Cancer in her mother; Hypertension in her father and mother.  ? ? ?ROS:  Please see the history of present illness. All other systems are reviewed and  Negative to the above problem except as noted.  ? ? ?PHYSICAL EXAM: ?VS:  BP (!) 138/94 (BP Location: Right Arm)   Pulse 64   Ht 5\' 4"   (1.626 m)   Wt 150 lb (68 kg)   SpO2 98%   BMI 25.75 kg/m?   ?GEN: Well nourished, well developed, in no acute distress  ?HEENT: normal  ?Neck: no JVD, carotid bruits ?Cardiac: RRR; no murmurs  No LE edema  ?Respiratory:  clear to auscultation bilaterally, ?Abdomen: soft, nontender, nondistended, + BS  No hepatomegaly   No masses  No bruits  ?MS: no deformity Moving all extremities   ?Skin: warm and dry, no rash ?Neuro:  Strength and sensation are intact ?Psych: euthymic mood, full affect ? ? ?EKG:  EKG is ordered today.  SR 64 bpm  Nonspecific ST changes  ? ? ?Lipid Panel ?No results found for: CHOL, TRIG, HDL, CHOLHDL, VLDL, LDLCALC, LDLDIRECT ?  ? ?Wt Readings from Last 3 Encounters:  ?12/30/21 150 lb (68 kg)  ?05/31/18 149 lb (67.6 kg)  ?01/27/18 148 lb (67.1 kg)  ?  ? ? ?ASSESSMENT AND PLAN: ? ?1  HTN   Pt with newly documented severe HTN  Not clear when it started as the last time she had her BP checked was a few years ago      She has a hx of some EtOH use but it does not appear severe     Otherwise no obvious causes ? ?Since starting amlodipine her BP has improved some, though not optimal   It is early ? ?Quick look echo today, LVEF and RVEF are normal   LV appears to have normal wall thickness ? ?REcomm   Will get labs today  (CBC, CMET, TSH, Uric acid)      ?Keep on amlodipine   Take as 5 mg per day     ?Reassess BP over the next couple weeks and titrate as needed ? ?2  Hx thyroid disorder   Will check TSH, free T3, free T4 ? ?3  HCM   Will check lipomed, Lpa andAPo B  Will also check A1C     ? ?Stay active    ? ?Patient needs to establish with a PCP   Wll try to arrange.   I will continue to follow BP for now     ? ? ?Current medicines are reviewed at length with the patient today.  The patient does not have concerns regarding medicines. ? ?Signed, ?Dietrich Pates, MD  ?12/30/2021 3:40 PM    ?[facility] ?[address] [place], [place], [place]  [address] ?Phone: [phone]; Fax: [phone]  ? ? ?

## 2021-12-31 LAB — CBC
Hematocrit: 47.7 % — ABNORMAL HIGH (ref 34.0–46.6)
Hemoglobin: 16.5 g/dL — ABNORMAL HIGH (ref 11.1–15.9)
MCH: 31.6 pg (ref 26.6–33.0)
MCHC: 34.6 g/dL (ref 31.5–35.7)
MCV: 91 fL (ref 79–97)
Platelets: 182 10*3/uL (ref 150–450)
RBC: 5.22 x10E6/uL (ref 3.77–5.28)
RDW: 12.4 % (ref 11.7–15.4)
WBC: 8.7 10*3/uL (ref 3.4–10.8)

## 2021-12-31 LAB — T3, FREE: T3, Free: 2.2 pg/mL (ref 2.0–4.4)

## 2021-12-31 LAB — NMR, LIPOPROFILE
Cholesterol, Total: 234 mg/dL — ABNORMAL HIGH (ref 100–199)
HDL Particle Number: 41.1 umol/L (ref >=30.5)
HDL-C: 115 mg/dL (ref >39)
LDL Particle Number: 936 nmol/L (ref <1000)
LDL Size: 21.3 nm (ref >20.5)
LDL-C (NIH Calc): 109 mg/dL — ABNORMAL HIGH (ref 0–99)
LP-IR Score: <25 (ref <=45)
Small LDL Particle Number: <90 nmol/L (ref <=527)
Triglycerides: 57 mg/dL (ref 0–149)

## 2021-12-31 LAB — LIPOPROTEIN A (LPA): Lipoprotein (a): 24.9 nmol/L (ref <75.0)

## 2021-12-31 LAB — COMPREHENSIVE METABOLIC PANEL
ALT: 30 IU/L (ref 0–32)
AST: 26 IU/L (ref 0–40)
Albumin/Globulin Ratio: 2 (ref 1.2–2.2)
Albumin: 4.9 g/dL — ABNORMAL HIGH (ref 3.8–4.8)
Alkaline Phosphatase: 52 IU/L (ref 44–121)
BUN/Creatinine Ratio: 15 (ref 9–23)
BUN: 13 mg/dL (ref 6–24)
Bilirubin Total: 0.6 mg/dL (ref 0.0–1.2)
CO2: 23 mmol/L (ref 20–29)
Calcium: 9.4 mg/dL (ref 8.7–10.2)
Chloride: 103 mmol/L (ref 96–106)
Creatinine, Ser: 0.87 mg/dL (ref 0.57–1.00)
Globulin, Total: 2.4 g/dL (ref 1.5–4.5)
Glucose: 87 mg/dL (ref 70–99)
Potassium: 4.1 mmol/L (ref 3.5–5.2)
Sodium: 140 mmol/L (ref 134–144)
Total Protein: 7.3 g/dL (ref 6.0–8.5)
eGFR: 82 mL/min/{1.73_m2} (ref >59)

## 2021-12-31 LAB — APOLIPOPROTEIN B: Apolipoprotein B: 76 mg/dL (ref <90)

## 2021-12-31 LAB — T4, FREE: Free T4: 1.16 ng/dL (ref 0.82–1.77)

## 2021-12-31 LAB — URIC ACID: Uric Acid: 3.9 mg/dL (ref 2.6–6.2)

## 2021-12-31 LAB — HEMOGLOBIN A1C
Est. average glucose Bld gHb Est-mCnc: 91 mg/dL
Hgb A1c MFr Bld: 4.8 % (ref 4.8–5.6)

## 2021-12-31 LAB — TSH: TSH: 2.84 u[IU]/mL (ref 0.450–4.500)

## 2022-01-01 ENCOUNTER — Telehealth: Payer: Self-pay

## 2022-01-01 NOTE — Telephone Encounter (Signed)
-----   Message from Pearline Cables, MD sent at 12/31/2021  4:26 PM EDT ----- ?Regarding: new pt ?Hi- we are going to see her as a new pt on request of Dr Tenny Craw.  Can we please reach out to her- thank you!  ? ?

## 2022-01-01 NOTE — Telephone Encounter (Signed)
Dr Lorelei Pont would like to schedule a NPT OV.  ?

## 2022-01-02 ENCOUNTER — Telehealth: Payer: Self-pay | Admitting: Internal Medicine

## 2022-01-02 DIAGNOSIS — Z79899 Other long term (current) drug therapy: Secondary | ICD-10-CM

## 2022-01-02 DIAGNOSIS — R030 Elevated blood-pressure reading, without diagnosis of hypertension: Secondary | ICD-10-CM

## 2022-01-02 NOTE — Telephone Encounter (Signed)
Called pt reviewed results and MD recommendations.  Pt reports was confused on how to take amlodipine.  Pt has taken 5- 2.5 mg tablets of amlodipine.  Reeducated pt to only take 5 mg of amlodipine PO QD.  Pt verbalizes understanding.  Pt reports the following BP readings: 169/104- 59 and 141/90.  I educated pt on the correct way to perform BP checks.  Advised pt to check BP and keep a log if BP continues to run high to call into the office.  Pt currently denies HA and blurry vision.  Advised pt if symptoms develop to call into the office.  Lab appointment scheduled for 01/08/22 as pt will be out of town for work.  Advised to increase fluid intake.  Pt verbalizes understanding.  Pt would like to know what f/u will be. I advised pt that MD will more than likely evaluate repeat labs and determine f/u.   I again advised pt to call in with any questions or concerns.  ?

## 2022-01-02 NOTE — Telephone Encounter (Signed)
Spoke to pt  ?She accidentally took 12.5 mg amldipine yesterday ?Tonight BP is high 169/104       ?SHe is heading out of town ?REcomm:  Take 5 mg bid amlodipine   Keep track of BP ? ?PT will need more called in to [facility]  ?

## 2022-01-02 NOTE — Telephone Encounter (Signed)
Patient was returning call. Please advise ?

## 2022-01-02 NOTE — Telephone Encounter (Signed)
I have contacted pt   See phone note  ?

## 2022-01-05 MED ORDER — AMLODIPINE BESYLATE 5 MG PO TABS: 5.0000 mg | ORAL_TABLET | Freq: Two times a day (BID) | ORAL | 3 refills | Status: DC

## 2022-01-05 NOTE — Telephone Encounter (Signed)
Per Dr. Tenny Craw: ?Spoke to pt  ?She accidentally took 12.5 mg amldipine yesterday ?Tonight BP is high 169/104       ?SHe is heading out of town ?REcomm:  Take 5 mg bid amlodipine   Keep track of BP ?  ?PT will need more called in to [facility]  ? ?Medication profile updated with Amlodipine 5mg  BID, refills sent to on [address]. ?

## 2022-01-05 NOTE — Addendum Note (Signed)
Addended by: Franchot Gallo on: 01/05/2022 08:08 AM ? ? Modules accepted: Orders ? ?

## 2022-01-08 ENCOUNTER — Other Ambulatory Visit: Payer: 59

## 2022-01-08 DIAGNOSIS — R030 Elevated blood-pressure reading, without diagnosis of hypertension: Secondary | ICD-10-CM

## 2022-01-08 DIAGNOSIS — Z79899 Other long term (current) drug therapy: Secondary | ICD-10-CM

## 2022-01-08 LAB — CBC
Hematocrit: 44.5 % (ref 34.0–46.6)
Hemoglobin: 15.8 g/dL (ref 11.1–15.9)
MCH: 31.9 pg (ref 26.6–33.0)
MCHC: 35.5 g/dL (ref 31.5–35.7)
MCV: 90 fL (ref 79–97)
Platelets: 173 10*3/uL (ref 150–450)
RBC: 4.96 x10E6/uL (ref 3.77–5.28)
RDW: 13.1 % (ref 11.7–15.4)
WBC: 6.8 10*3/uL (ref 3.4–10.8)

## 2022-01-10 NOTE — Progress Notes (Signed)
[facility] [address], [address] [place], [place] [address] [phone]  Date:  01/14/2022   Name:  Martha Johnston   DOB:  1970/09/01   MRN:  [id number]  PCP:  Pearline Cables, MD    Chief Complaint: New Patient (Initial Visit) (Concerns/ questions: to stabilize BP/Tdap: none/GYN (pap): Dr Levoy/Colonoscopy: none yet)   History of Present Illness:  Martha Johnston is a 51 y.o. very pleasant female patient who presents with the following:  Seen today as a new patient - referred to [place] by her cardiologist Dr Tenny Craw  Pt notes her BP was running high at home- her mother checked it for her incidentally and they were alarmed about elevation. She contacted Dr. Tenny Craw and was seen They did what sounds like a limited echo in the office, patient reports this was normal A renal artery ultrasound is planned to rule out stenosis.  Patient reports Dr. Tenny Craw had mentioned as doing a UA which we can certainly take care of  She is currently taking amlodipine 5 twice daily as well as chlorthalidone, tolerating well.  Perhaps some minimal lower extremity edema  Her parents both have had HTN since they were in their 40s She did have HEELP during her pregnancy and has been noted to have elevated BP at the MD office some of the time-this was thought due to whitecoat syndrome  Patient notes she is feeling well, she has very good exercise tolerance.  She likes to do workout classes at the gym.  Combination cardio and strength type classes  She does not smoke, moderate alcohol intake No chance of current pregnancy, currently menstruating  Partial thyroidectomy - recent TSH normal   Checking her home blood pressure cuff against our cuff today it does seem that her home cuff may be running somewhat high, systolic is 10-15 points higher on her home cuff  BP Readings from Last 3 Encounters:  01/14/22 (!) 152/99  12/30/21 (!) 138/94  05/31/18 126/84        Patient Active Problem List   Diagnosis Date Noted   Anxiety disorder, unspecified 02/03/2016   PALPITATIONS 05/23/2007   HYPERTENSION 05/09/2007   FEVER BLISTER 04/22/2007   ELEVATED BLOOD PRESSURE WITHOUT DIAGNOSIS OF HYPERTENSION 04/22/2007    No past medical history on file.  Past Surgical History:  Procedure Laterality Date   BREAST SURGERY     cyst removed right breast    CESAREAN SECTION     x2   THYROIDECTOMY, PARTIAL      Social History   Tobacco Use   Smoking status: Never   Smokeless tobacco: Never  Vaping Use   Vaping Use: Never used  Substance Use Topics   Alcohol use: Yes    Alcohol/week: 0.0 standard drinks    Comment: 2 DRINKS A WEEK    Drug use: No    Family History  Problem Relation Age of Onset   Breast cancer Mother    Cancer Mother        skin   Hypertension Mother    Hypertension Father     Allergies  Allergen Reactions   Other Other (See Comments)    "pain medications"    Medication list has been reviewed and updated.  Current Outpatient Medications on File Prior to Visit  Medication Sig Dispense Refill   acetaminophen (TYLENOL) 325 MG tablet Take 650 mg by mouth every 6 (six) hours as needed.  amLODipine (NORVASC) 5 MG tablet Take 1 tablet (5 mg total) by mouth 2 (two) times daily. 180 tablet 3   chlorthalidone (HYGROTON) 25 MG tablet Take 0.5 tablets (12.5 mg total) by mouth daily. 45 tablet 3   No current facility-administered medications on file prior to visit.    Review of Systems:  As per HPI- otherwise negative.   Physical Examination: Vitals:   01/14/22 0905  BP: 123/82  Pulse: 62  Resp: 18  Temp: 97.6 F (36.4 C)  SpO2: 98%   Vitals:   01/14/22 0905  Weight: 147 lb 3.2 oz (66.8 kg)   Body mass index is 25.27 kg/m. Ideal Body Weight:    GEN: no acute distress.  Normal weight, looks well HEENT: Atraumatic, Normocephalic.  Ears and Nose: No external deformity. CV: RRR, No M/G/R. No JVD.  No thrill. No extra heart sounds. PULM: CTA B, no wheezes, crackles, rhonchi. No retractions. No resp. distress. No accessory muscle use. ABD: S, NT, ND. No rebound. No HSM. EXTR: No c/c/e PSYCH: Normally interactive. Conversant.   Results for orders placed or performed in visit on 01/14/22  POCT urinalysis dipstick  Result Value Ref Range   Color, UA yellow yellow   Clarity, UA clear clear   Glucose, UA negative negative mg/dL   Bilirubin, UA negative negative   Ketones, POC UA negative negative mg/dL   Spec Grav, UA 4.132 4.401 - 1.025   Blood, UA small (A) negative   pH, UA 6.0 5.0 - 8.0   Protein Ur, POC negative negative mg/dL   Urobilinogen, UA 0.2 0.2 or 1.0 E.U./dL   Nitrite, UA Negative Negative   Leukocytes, UA Negative Negative    Assessment and Plan: Essential hypertension - Plan: CT CARDIAC SCORING (SELF PAY ONLY)  Medication monitoring encounter - Plan: POCT urinalysis dipstick  Patient seen today to establish care.  She is currently being treated for likely essential hypertension per her cardiologist, blood pressure looks good today.  She is interested in controlling her blood pressure without medication.  I advised her that she likely has at least some genetic component of her elevated blood pressure.  For now would encourage her to continue medications.  However, certainly can try decreasing salt intake and perhaps lose 5 pounds, this may enable her to decrease medication dose  She is interested in a coronary calcium score which I ordered for her today  Urine dipstick shows no concerning proteinuria, patient is menstruating  Signed Abbe Amsterdam, MD

## 2022-01-13 ENCOUNTER — Telehealth: Payer: Self-pay | Admitting: Internal Medicine

## 2022-01-13 DIAGNOSIS — R030 Elevated blood-pressure reading, without diagnosis of hypertension: Secondary | ICD-10-CM

## 2022-01-13 DIAGNOSIS — Z79899 Other long term (current) drug therapy: Secondary | ICD-10-CM

## 2022-01-13 MED ORDER — CHLORTHALIDONE 25 MG PO TABS: 12.5000 mg | ORAL_TABLET | Freq: Every day | ORAL | 3 refills | Status: DC

## 2022-01-13 NOTE — Telephone Encounter (Signed)
Dr Tenny Craw spoke with the pt and she is seeing Abbe Amsterdam MD 01/14/22... pt to possibly have a UA at that appt.

## 2022-01-13 NOTE — Telephone Encounter (Addendum)
Renal Artery Ultrasound ordered per Dr Ross/ Chlorthalidone 12.5 mg daily.   Dr Tenny Craw spoke with the pt today.   Pt to have UA 01/14/22 with her PCP/ Abbe Amsterdam MD.

## 2022-01-13 NOTE — Telephone Encounter (Signed)
Pt texted in taht her BP is 130 to 140/ 90 to 100     Has been on amlodipine bid  I would recomm  1  Renal artery ultrasound 2  UA when she comes in to get this  3  Add chlorthalidone 12.5 mg      Follow   Has aappt to establish with J Copland tomorrow

## 2022-01-13 NOTE — Telephone Encounter (Signed)
Patient is calling for her lab results.  

## 2022-01-14 ENCOUNTER — Ambulatory Visit: Payer: 59 | Admitting: Family Medicine

## 2022-01-14 VITALS — BP 152/99 | HR 62 | Temp 97.6°F | Resp 18 | Wt 147.2 lb

## 2022-01-14 DIAGNOSIS — Z5181 Encounter for therapeutic drug level monitoring: Secondary | ICD-10-CM

## 2022-01-14 DIAGNOSIS — I10 Essential (primary) hypertension: Secondary | ICD-10-CM | POA: Diagnosis not present

## 2022-01-14 LAB — POCT URINALYSIS DIP (MANUAL ENTRY)
Bilirubin, UA: NEGATIVE
Glucose, UA: NEGATIVE mg/dL
Ketones, POC UA: NEGATIVE mg/dL
Leukocytes, UA: NEGATIVE
Nitrite, UA: NEGATIVE
Protein Ur, POC: NEGATIVE mg/dL
Spec Grav, UA: 1.01 (ref 1.010–1.025)
Urobilinogen, UA: 0.2 E.U./dL
pH, UA: 6 (ref 5.0–8.0)

## 2022-01-14 NOTE — Patient Instructions (Addendum)
Good to meet you today It seems like you are on the right track with your BP I ordered a coronary CT for you to help [place] determine how aggressive we should be with your lipid management As we discussed- you might try and lost 5lbs and cut down on salt intake- these measures may help to bring down your BP If you are running consistently lower than 115- 120/ 80 we will try coming down on you BP meds- would first suggest cutting amlodipine to 5 mg once a day

## 2022-01-20 ENCOUNTER — Ambulatory Visit (HOSPITAL_BASED_OUTPATIENT_CLINIC_OR_DEPARTMENT_OTHER)
Admission: RE | Admit: 2022-01-20 | Discharge: 2022-01-20 | Disposition: A | Discharge status: Home / Self Care | Payer: 59 | Source: Ambulatory Visit | Attending: Family Medicine | Admitting: Family Medicine

## 2022-01-20 DIAGNOSIS — I10 Essential (primary) hypertension: Secondary | ICD-10-CM | POA: Insufficient documentation

## 2022-01-21 ENCOUNTER — Encounter: Payer: Self-pay | Admitting: Family Medicine

## 2022-01-27 ENCOUNTER — Telehealth: Payer: Self-pay | Admitting: Internal Medicine

## 2022-01-27 NOTE — Telephone Encounter (Signed)
Patient is set to have renal USN on 01/29/22   When she comes in for this have her check BMET as well.   Just started chlorthalidone.

## 2022-01-28 ENCOUNTER — Other Ambulatory Visit: Payer: Self-pay | Admitting: *Deleted

## 2022-01-28 DIAGNOSIS — R002 Palpitations: Secondary | ICD-10-CM

## 2022-01-28 NOTE — Telephone Encounter (Signed)
Mailbox full unable to leave message Will try later ./cy 

## 2022-01-28 NOTE — Telephone Encounter (Signed)
Pt aware and will have labs done tomorrow .Martha Johnston

## 2022-01-29 ENCOUNTER — Ambulatory Visit (HOSPITAL_COMMUNITY)
Admission: RE | Admit: 2022-01-29 | Discharge: 2022-01-29 | Disposition: A | Discharge status: Home / Self Care | Payer: 59 | Source: Ambulatory Visit | Attending: Internal Medicine | Admitting: Internal Medicine

## 2022-01-29 DIAGNOSIS — Z79899 Other long term (current) drug therapy: Secondary | ICD-10-CM | POA: Diagnosis not present

## 2022-01-29 DIAGNOSIS — R030 Elevated blood-pressure reading, without diagnosis of hypertension: Secondary | ICD-10-CM | POA: Insufficient documentation

## 2022-02-02 ENCOUNTER — Telehealth: Payer: Self-pay | Admitting: Internal Medicine

## 2022-02-02 DIAGNOSIS — R030 Elevated blood-pressure reading, without diagnosis of hypertension: Secondary | ICD-10-CM

## 2022-02-02 DIAGNOSIS — Z79899 Other long term (current) drug therapy: Secondary | ICD-10-CM

## 2022-02-02 DIAGNOSIS — R002 Palpitations: Secondary | ICD-10-CM

## 2022-02-02 NOTE — Telephone Encounter (Signed)
Patient forgot to get labs on Friday   REcomm BMET and Mg    She plans to come in tomorrow (Tuesday) to get them done

## 2022-02-03 ENCOUNTER — Other Ambulatory Visit: Payer: 59

## 2022-02-03 NOTE — Addendum Note (Signed)
Addended by: Loa Socks on: 02/03/2022 08:18 AM   Modules accepted: Orders

## 2022-02-03 NOTE — Telephone Encounter (Signed)
Order for BMET and Mg placed on this pt per Dr. Tenny Craw.  Lab appt made for the pt to come into our office today 6/13, to have labs drawn. Will make Dr. Tenny Craw aware of completion of this message.

## 2022-02-13 ENCOUNTER — Other Ambulatory Visit: Payer: 59

## 2022-02-13 DIAGNOSIS — R030 Elevated blood-pressure reading, without diagnosis of hypertension: Secondary | ICD-10-CM

## 2022-02-13 DIAGNOSIS — R002 Palpitations: Secondary | ICD-10-CM

## 2022-02-13 DIAGNOSIS — Z79899 Other long term (current) drug therapy: Secondary | ICD-10-CM

## 2022-02-13 LAB — BASIC METABOLIC PANEL
BUN/Creatinine Ratio: 17 (ref 9–23)
BUN: 15 mg/dL (ref 6–24)
CO2: 26 mmol/L (ref 20–29)
Calcium: 9.5 mg/dL (ref 8.7–10.2)
Chloride: 99 mmol/L (ref 96–106)
Creatinine, Ser: 0.88 mg/dL (ref 0.57–1.00)
Glucose: 91 mg/dL (ref 70–99)
Potassium: 3.7 mmol/L (ref 3.5–5.2)
Sodium: 139 mmol/L (ref 134–144)
eGFR: 81 mL/min/{1.73_m2} (ref >59)

## 2022-02-13 LAB — MAGNESIUM: Magnesium: 1.9 mg/dL (ref 1.6–2.3)

## 2023-01-05 ENCOUNTER — Other Ambulatory Visit: Payer: Self-pay | Admitting: Internal Medicine

## 2023-01-18 NOTE — Progress Notes (Unsigned)
[facility] [address], [address] [place], [place] [address] [phone]  Date:  01/20/2023   Name:  Martha Johnston   DOB:  1970/12/08   MRN:  [id number]  PCP:  Pearline Cables, MD    Chief Complaint: No chief complaint on file.   History of Present Illness:  Martha Johnston is a 52 y.o. very pleasant female patient who presents with the following:  Patient seen today for physical exam Most recent visit with myself 1 year ago-at that time we saw her as a new patient, referred by her cardiologist Dr. Tenny Craw  History of HEELP during pregnancy, diagnosed with essential hypertension per cardiology Also history of partial thyroidectomy  She enjoys exercise at the gym, does not smoke  Tetanus: Shingrix Colon cancer screening Pap smear Mammogram Hepatitis C screening Most recent labs on chart from 1 year ago  We had a coronary calcium score last year, it was 0  Patient Active Problem List   Diagnosis Date Noted   Anxiety disorder, unspecified 02/03/2016   PALPITATIONS 05/23/2007   HYPERTENSION 05/09/2007   FEVER BLISTER 04/22/2007   ELEVATED BLOOD PRESSURE WITHOUT DIAGNOSIS OF HYPERTENSION 04/22/2007    No past medical history on file.  Past Surgical History:  Procedure Laterality Date   BREAST SURGERY     cyst removed right breast    CESAREAN SECTION     x2   THYROIDECTOMY, PARTIAL      Social History   Tobacco Use   Smoking status: Never   Smokeless tobacco: Never  Vaping Use   Vaping Use: Never used  Substance Use Topics   Alcohol use: Yes    Alcohol/week: 0.0 standard drinks of alcohol    Comment: 2 DRINKS A WEEK    Drug use: No    Family History  Problem Relation Age of Onset   Breast cancer Mother    Cancer Mother        skin   Hypertension Mother    Hypertension Father     Allergies  Allergen Reactions   Other Other (See Comments)    "pain medications"    Medication list has been  reviewed and updated.  Current Outpatient Medications on File Prior to Visit  Medication Sig Dispense Refill   acetaminophen (TYLENOL) 325 MG tablet Take 650 mg by mouth every 6 (six) hours as needed.     amLODipine (NORVASC) 5 MG tablet Take 1 tablet (5 mg total) by mouth 2 (two) times daily. 180 tablet 3   chlorthalidone (HYGROTON) 25 MG tablet Take 0.5 tablets (12.5 mg total) by mouth daily. Patient needs appointment for future refills. Please call office to schedule appt. 1st attempt. 15 tablet 0   No current facility-administered medications on file prior to visit.    Review of Systems:  As per HPI- otherwise negative.   Physical Examination: There were no vitals filed for this visit. There were no vitals filed for this visit. There is no height or weight on file to calculate BMI. Ideal Body Weight:    GEN: no acute distress. HEENT: Atraumatic, Normocephalic.  Ears and Nose: No external deformity. CV: RRR, No M/G/R. No JVD. No thrill. No extra heart sounds. PULM: CTA B, no wheezes, crackles, rhonchi. No retractions. No resp. distress. No accessory muscle use. ABD: S, NT, ND, +BS. No rebound. No HSM. EXTR: No c/c/e PSYCH: Normally interactive. Conversant.    Assessment and Plan: *** Physical  exam today.  Encouraged healthy diet and exercise routine  Will plan further follow- up pending labs.  Signed Abbe Amsterdam, MD

## 2023-01-18 NOTE — Patient Instructions (Signed)
It was good to see you again today, I will be in touch with your labs soon as possible  Ok to try cutting down amlodipine to 5mg  - a lower dose may give you less swelling.  BP goal under about 130-135/85  Cologuard kit will come to you.  Let me call the [facility] and see what type of mammogram order you need   Tetanus if you have any wound!   Recommend shingles vaccine series at your convenience

## 2023-01-20 ENCOUNTER — Ambulatory Visit (INDEPENDENT_AMBULATORY_CARE_PROVIDER_SITE_OTHER): Payer: 59 | Admitting: Family Medicine

## 2023-01-20 VITALS — BP 118/78 | HR 81 | Temp 98.1°F | Resp 18 | Ht 64.0 in | Wt 150.0 lb

## 2023-01-20 DIAGNOSIS — R5383 Other fatigue: Secondary | ICD-10-CM | POA: Diagnosis not present

## 2023-01-20 DIAGNOSIS — Z130 Encounter for screening for diseases of the blood and blood-forming organs and certain disorders involving the immune mechanism: Secondary | ICD-10-CM

## 2023-01-20 DIAGNOSIS — Z1211 Encounter for screening for malignant neoplasm of colon: Secondary | ICD-10-CM

## 2023-01-20 DIAGNOSIS — I10 Essential (primary) hypertension: Secondary | ICD-10-CM

## 2023-01-20 DIAGNOSIS — Z13220 Encounter for screening for lipoid disorders: Secondary | ICD-10-CM

## 2023-01-20 DIAGNOSIS — Z1329 Encounter for screening for other suspected endocrine disorder: Secondary | ICD-10-CM

## 2023-01-20 DIAGNOSIS — Z131 Encounter for screening for diabetes mellitus: Secondary | ICD-10-CM | POA: Diagnosis not present

## 2023-01-20 DIAGNOSIS — Z1159 Encounter for screening for other viral diseases: Secondary | ICD-10-CM

## 2023-01-20 DIAGNOSIS — Z0000 Encounter for general adult medical examination without abnormal findings: Secondary | ICD-10-CM

## 2023-01-20 DIAGNOSIS — Z1231 Encounter for screening mammogram for malignant neoplasm of breast: Secondary | ICD-10-CM

## 2023-01-20 LAB — COMPREHENSIVE METABOLIC PANEL
ALT: 14 U/L (ref 0–35)
AST: 16 U/L (ref 0–37)
Albumin: 4.2 g/dL (ref 3.5–5.2)
Alkaline Phosphatase: 39 U/L (ref 39–117)
BUN: 12 mg/dL (ref 6–23)
CO2: 29 mEq/L (ref 19–32)
Calcium: 9 mg/dL (ref 8.4–10.5)
Chloride: 100 mEq/L (ref 96–112)
Creatinine, Ser: 0.75 mg/dL (ref 0.40–1.20)
GFR: 92.64 mL/min (ref >60.00)
Glucose, Bld: 90 mg/dL (ref 70–99)
Potassium: 3.9 mEq/L (ref 3.5–5.1)
Sodium: 137 mEq/L (ref 135–145)
Total Bilirubin: 0.8 mg/dL (ref 0.2–1.2)
Total Protein: 6.8 g/dL (ref 6.0–8.3)

## 2023-01-20 LAB — CBC
HCT: 45.1 % (ref 36.0–46.0)
Hemoglobin: 15.5 g/dL — ABNORMAL HIGH (ref 12.0–15.0)
MCHC: 34.4 g/dL (ref 30.0–36.0)
MCV: 90.7 fl (ref 78.0–100.0)
Platelets: 174 10*3/uL (ref 150.0–400.0)
RBC: 4.97 Mil/uL (ref 3.87–5.11)
RDW: 12.7 % (ref 11.5–15.5)
WBC: 6.4 10*3/uL (ref 4.0–10.5)

## 2023-01-20 LAB — LIPID PANEL
Cholesterol: 207 mg/dL — ABNORMAL HIGH (ref 0–200)
HDL: 92.6 mg/dL (ref >39.00)
LDL Cholesterol: 101 mg/dL — ABNORMAL HIGH (ref 0–99)
NonHDL: 113.96
Total CHOL/HDL Ratio: 2
Triglycerides: 66 mg/dL (ref 0.0–149.0)
VLDL: 13.2 mg/dL (ref 0.0–40.0)

## 2023-01-20 LAB — VITAMIN D 25 HYDROXY (VIT D DEFICIENCY, FRACTURES): VITD: 37.82 ng/mL (ref 30.00–100.00)

## 2023-01-20 LAB — HEMOGLOBIN A1C: Hgb A1c MFr Bld: 4.7 % (ref 4.6–6.5)

## 2023-01-20 LAB — TSH: TSH: 2.47 u[IU]/mL (ref 0.35–5.50)

## 2023-01-20 MED ORDER — CHLORTHALIDONE 25 MG PO TABS
12.5000 mg | ORAL_TABLET | Freq: Every day | ORAL | 3 refills | Status: DC
Start: 2023-01-20 — End: 2024-01-24

## 2023-01-20 MED ORDER — AMLODIPINE BESYLATE 5 MG PO TABS
5.0000 mg | ORAL_TABLET | Freq: Two times a day (BID) | ORAL | 3 refills | Status: DC
Start: 2023-01-20 — End: 2024-01-24

## 2023-01-21 LAB — HEPATITIS C ANTIBODY: Hepatitis C Ab: NONREACTIVE

## 2023-01-29 ENCOUNTER — Other Ambulatory Visit: Payer: Self-pay | Admitting: Family Medicine

## 2023-01-29 LAB — COLOGUARD: COLOGUARD: NEGATIVE

## 2023-03-04 ENCOUNTER — Ambulatory Visit
Admission: RE | Admit: 2023-03-04 | Discharge: 2023-03-04 | Disposition: A | Discharge status: Home / Self Care | Payer: 59 | Source: Ambulatory Visit | Attending: Family Medicine | Admitting: Family Medicine

## 2023-03-04 DIAGNOSIS — Z1231 Encounter for screening mammogram for malignant neoplasm of breast: Secondary | ICD-10-CM

## 2023-03-05 ENCOUNTER — Other Ambulatory Visit: Payer: Self-pay | Admitting: Family Medicine

## 2023-03-05 DIAGNOSIS — R928 Other abnormal and inconclusive findings on diagnostic imaging of breast: Secondary | ICD-10-CM

## 2023-03-08 ENCOUNTER — Other Ambulatory Visit: Payer: Self-pay | Admitting: Family Medicine

## 2023-03-08 ENCOUNTER — Telehealth: Payer: Self-pay | Admitting: Family Medicine

## 2023-03-08 ENCOUNTER — Other Ambulatory Visit: Payer: Self-pay | Admitting: Internal Medicine

## 2023-03-08 ENCOUNTER — Ambulatory Visit
Admission: RE | Admit: 2023-03-08 | Discharge: 2023-03-08 | Disposition: A | Discharge status: Home / Self Care | Payer: 59 | Source: Ambulatory Visit | Attending: Family Medicine | Admitting: Family Medicine

## 2023-03-08 DIAGNOSIS — R921 Mammographic calcification found on diagnostic imaging of breast: Secondary | ICD-10-CM

## 2023-03-08 DIAGNOSIS — R928 Other abnormal and inconclusive findings on diagnostic imaging of breast: Secondary | ICD-10-CM

## 2023-03-08 NOTE — Telephone Encounter (Signed)
[facility] is changing the order to have you cosign instead- this is via Epic. (Since you are DOD)

## 2023-03-08 NOTE — Telephone Encounter (Signed)
Breast cancer center called requesting a signature for a Cosign for the left breast biopsy. She's arriving tmr morning, they just need to make sure everything is good to go. Tele: [phone].

## 2023-03-08 NOTE — Telephone Encounter (Signed)
 Please see previous note.

## 2023-03-08 NOTE — Telephone Encounter (Signed)
 Order cosigned.

## 2023-03-09 ENCOUNTER — Ambulatory Visit
Admission: RE | Admit: 2023-03-09 | Discharge: 2023-03-09 | Disposition: A | Discharge status: Home / Self Care | Payer: 59 | Source: Ambulatory Visit | Attending: Family Medicine | Admitting: Family Medicine

## 2023-03-09 ENCOUNTER — Ambulatory Visit: Admission: RE | Admit: 2023-03-09 | Payer: 59 | Source: Ambulatory Visit

## 2023-03-09 DIAGNOSIS — R928 Other abnormal and inconclusive findings on diagnostic imaging of breast: Secondary | ICD-10-CM

## 2023-03-09 DIAGNOSIS — R921 Mammographic calcification found on diagnostic imaging of breast: Secondary | ICD-10-CM

## 2023-03-09 HISTORY — PX: BREAST BIOPSY: SHX20

## 2024-01-22 NOTE — Progress Notes (Unsigned)
 [facility] [address], [address] [place], [place] [address] [phone]  Date:  01/24/2024   Name:  Martha Johnston   DOB:  [birth year]   MRN:  [id number]  PCP:  Kaylee Partridge, MD    Chief Complaint: No chief complaint on file.   History of Present Illness:  Martha Johnston is a 53 y.o. very pleasant female patient who presents with the following:  Patient seen today for physical exam Most recent visit with myself was about 1 year ago  History of HEELP during pregnancy, diagnosed with essential hypertension per cardiology Also history of partial thyroidectomy She takes amlodipine  5 mg, chlorthalidone  12.5 She has previously seen cardiology for palpitations, but I believe her most recent visit was a couple of years ago   She enjoys exercise at the gym, never a smoker, moderate alcohol Her son Martha Johnston is in school at [employer] Her older daughter, Martha Johnston is in school in [place]    Cervical cancer screening per her gynecology Mammogram completed last July-she did need a biopsy which came back negative Recommend Shingrix Recommend Tdap Can update lab work today  Patient Active Problem List   Diagnosis Date Noted   Anxiety disorder, unspecified 02/03/2016   PALPITATIONS 05/23/2007   Essential hypertension 05/09/2007   Herpes simplex virus (HSV) infection 04/22/2007   ELEVATED BLOOD PRESSURE WITHOUT DIAGNOSIS OF HYPERTENSION 04/22/2007    No past medical history on file.  Past Surgical History:  Procedure Laterality Date   BREAST BIOPSY     BREAST BIOPSY Left 03/09/2023   MM LT BREAST BX W LOC DEV 1ST LESION IMAGE BX SPEC STEREO GUIDE 03/09/2023 [facility]   BREAST SURGERY     cyst removed right breast    CESAREAN SECTION     x2   THYROIDECTOMY, PARTIAL      Social History   Tobacco Use   Smoking status: Never   Smokeless tobacco: Never  Vaping Use   Vaping status: Never Used  Substance Use Topics   Alcohol  use: Yes    Alcohol/week: 0.0 standard drinks of alcohol    Comment: 2 DRINKS A WEEK    Drug use: No    Family History  Problem Relation Age of Onset   Breast cancer Mother    Cancer Mother        skin   Hypertension Mother    Hypertension Father     Allergies  Allergen Reactions   Other Other (See Comments)    "pain medications"    Medication list has been reviewed and updated.  Current Outpatient Medications on File Prior to Visit  Medication Sig Dispense Refill   acetaminophen (TYLENOL) 325 MG tablet Take 650 mg by mouth every 6 (six) hours as needed.     amLODipine  (NORVASC ) 5 MG tablet Take 1 tablet (5 mg total) by mouth 2 (two) times daily. 180 tablet 3   chlorthalidone  (HYGROTON ) 25 MG tablet Take 0.5 tablets (12.5 mg total) by mouth daily. 45 tablet 3   No current facility-administered medications on file prior to visit.    Review of Systems:  As per HPI- otherwise negative.   Physical Examination: There were no vitals filed for this visit. There were no vitals filed for this visit. There is no height or weight on file to calculate BMI. Ideal Body Weight:    GEN: no acute distress. HEENT: Atraumatic, Normocephalic.  Ears and Nose: No external deformity.  CV: RRR, No M/G/R. No JVD. No thrill. No extra heart sounds. PULM: CTA B, no wheezes, crackles, rhonchi. No retractions. No resp. distress. No accessory muscle use. ABD: S, NT, ND, +BS. No rebound. No HSM. EXTR: No c/c/e PSYCH: Normally interactive. Conversant.    Assessment and Plan: *** Physical exam today.  Encouraged healthy diet and exercise routine Will plan further follow- up pending labs.  Signed Gates Kasal, MD

## 2024-01-22 NOTE — Patient Instructions (Signed)
 It was good to see you today, I will be in touch with your blood work Recommend shingles vaccine series and tetanus booster Please be sure to set up your mammogram for later on this summer; can be scheduled anytime after mid July- if you prefer to do every other year that is better than not at all!   Please let me know how you do with your BP regimen Please discuss your hormone situation with your GYN; it may be time to pull your IUD and start "HRT"- however this decision may be tempered by family history of breast cancer.

## 2024-01-24 ENCOUNTER — Ambulatory Visit (INDEPENDENT_AMBULATORY_CARE_PROVIDER_SITE_OTHER): Payer: 59 | Admitting: Family Medicine

## 2024-01-24 ENCOUNTER — Encounter: Payer: Self-pay | Admitting: Family Medicine

## 2024-01-24 VITALS — BP 132/74 | HR 67 | Temp 98.1°F | Ht 64.0 in | Wt 154.8 lb

## 2024-01-24 DIAGNOSIS — Z13220 Encounter for screening for lipoid disorders: Secondary | ICD-10-CM | POA: Diagnosis not present

## 2024-01-24 DIAGNOSIS — Z0000 Encounter for general adult medical examination without abnormal findings: Secondary | ICD-10-CM

## 2024-01-24 DIAGNOSIS — Z131 Encounter for screening for diabetes mellitus: Secondary | ICD-10-CM

## 2024-01-24 DIAGNOSIS — N951 Menopausal and female climacteric states: Secondary | ICD-10-CM

## 2024-01-24 DIAGNOSIS — Z130 Encounter for screening for diseases of the blood and blood-forming organs and certain disorders involving the immune mechanism: Secondary | ICD-10-CM

## 2024-01-24 DIAGNOSIS — Z1329 Encounter for screening for other suspected endocrine disorder: Secondary | ICD-10-CM

## 2024-01-24 DIAGNOSIS — Z1231 Encounter for screening mammogram for malignant neoplasm of breast: Secondary | ICD-10-CM

## 2024-01-24 DIAGNOSIS — I10 Essential (primary) hypertension: Secondary | ICD-10-CM

## 2024-01-24 LAB — CBC
HCT: 44.3 % (ref 36.0–46.0)
Hemoglobin: 15.5 g/dL — ABNORMAL HIGH (ref 12.0–15.0)
MCHC: 35.1 g/dL (ref 30.0–36.0)
MCV: 88.2 fl (ref 78.0–100.0)
Platelets: 169 10*3/uL (ref 150.0–400.0)
RBC: 5.02 Mil/uL (ref 3.87–5.11)
RDW: 13.2 % (ref 11.5–15.5)
WBC: 6.2 10*3/uL (ref 4.0–10.5)

## 2024-01-24 LAB — LIPID PANEL
Cholesterol: 234 mg/dL — ABNORMAL HIGH (ref 0–200)
HDL: 98.5 mg/dL (ref >39.00)
LDL Cholesterol: 123 mg/dL — ABNORMAL HIGH (ref 0–99)
NonHDL: 135.5
Total CHOL/HDL Ratio: 2
Triglycerides: 62 mg/dL (ref 0.0–149.0)
VLDL: 12.4 mg/dL (ref 0.0–40.0)

## 2024-01-24 LAB — COMPREHENSIVE METABOLIC PANEL WITH GFR
ALT: 15 U/L (ref 0–35)
AST: 19 U/L (ref 0–37)
Albumin: 4.8 g/dL (ref 3.5–5.2)
Alkaline Phosphatase: 51 U/L (ref 39–117)
BUN: 17 mg/dL (ref 6–23)
CO2: 31 meq/L (ref 19–32)
Calcium: 9.5 mg/dL (ref 8.4–10.5)
Chloride: 101 meq/L (ref 96–112)
Creatinine, Ser: 0.82 mg/dL (ref 0.40–1.20)
GFR: 82.64 mL/min (ref >60.00)
Glucose, Bld: 97 mg/dL (ref 70–99)
Potassium: 3.4 meq/L — ABNORMAL LOW (ref 3.5–5.1)
Sodium: 140 meq/L (ref 135–145)
Total Bilirubin: 1 mg/dL (ref 0.2–1.2)
Total Protein: 7 g/dL (ref 6.0–8.3)

## 2024-01-24 LAB — HEMOGLOBIN A1C: Hgb A1c MFr Bld: 5 % (ref 4.6–6.5)

## 2024-01-24 LAB — TSH: TSH: 2.2 u[IU]/mL (ref 0.35–5.50)

## 2024-01-24 MED ORDER — CHLORTHALIDONE 25 MG PO TABS
12.5000 mg | ORAL_TABLET | Freq: Every day | ORAL | 3 refills | Status: DC
Start: 2024-01-24 — End: 2024-01-27

## 2024-01-24 MED ORDER — AMLODIPINE BESYLATE 10 MG PO TABS: 10.0000 mg | ORAL_TABLET | Freq: Two times a day (BID) | ORAL | 3 refills | Status: DC

## 2024-01-27 ENCOUNTER — Telehealth: Payer: Self-pay

## 2024-01-27 DIAGNOSIS — I10 Essential (primary) hypertension: Secondary | ICD-10-CM

## 2024-01-27 MED ORDER — AMLODIPINE BESYLATE 5 MG PO TABS: 5.0000 mg | ORAL_TABLET | Freq: Every day | ORAL | 3 refills | Status: AC

## 2024-01-27 MED ORDER — CHLORTHALIDONE 25 MG PO TABS: 25.0000 mg | ORAL_TABLET | Freq: Every day | ORAL | 3 refills | Status: AC

## 2024-01-27 NOTE — Telephone Encounter (Unsigned)
 Copied from CRM [id number]. Topic: Clinical - Prescription Issue >> Jan 27, 2024  4:32 PM Jenice Mitts wrote: Reason for CRM: Patient is calling because she is having swelling in her ankles and was told to call back to increase or decrease a medication. she did not remeber whar dr copland told her   she can be reached by number on file

## 2024-01-27 NOTE — Telephone Encounter (Signed)
 Copied from CRM [id number]. Topic: Clinical - Prescription Issue >> Jan 27, 2024  4:32 PM Jenice Mitts wrote: Reason for CRM: Patient is calling because she is having swelling in her ankles and was told to call back to increase or decrease a medication. she did not remeber whar dr copland told her   she can be reached by number on file

## 2024-01-27 NOTE — Telephone Encounter (Signed)
 Called her- will drop amlodipine  to 5 and increase chlorthalidone  to 25 for BP management.  Swelling of ankles as SE of amlodipine  10 mg

## 2025-01-24 ENCOUNTER — Encounter: Admitting: Family Medicine
